# Patient Record
Sex: Female | Born: 1980 | Race: Black or African American | Hispanic: No | Marital: Single | Smoking: Former smoker
Health system: Southern US, Community
[De-identification: ages and names within clinical notes are randomized; demographics above are authoritative.]

## PROBLEM LIST (undated history)

## (undated) DIAGNOSIS — J189 Pneumonia, unspecified organism: Secondary | ICD-10-CM

## (undated) HISTORY — PX: OVARIAN CYST SURGERY: SHX726

## (undated) HISTORY — PX: TUBAL LIGATION: SHX77

---

## 1999-04-05 ENCOUNTER — Other Ambulatory Visit: Admission: RE | Admit: 1999-04-05 | Discharge: 1999-04-05 | Payer: Self-pay | Admitting: Obstetrics

## 1999-04-07 ENCOUNTER — Other Ambulatory Visit: Admission: RE | Admit: 1999-04-07 | Discharge: 1999-04-07 | Payer: Self-pay | Admitting: Obstetrics

## 1999-12-20 ENCOUNTER — Other Ambulatory Visit: Admission: RE | Admit: 1999-12-20 | Discharge: 1999-12-20 | Payer: Self-pay | Admitting: *Deleted

## 2019-09-26 ENCOUNTER — Other Ambulatory Visit: Payer: Self-pay | Admitting: Neurosurgery

## 2019-10-03 NOTE — Progress Notes (Addendum)
No Pharmacies Listed    Your procedure is scheduled on Thursday, October 8th.  Report to [facility]tr[facility] "A" at 12:45 P.M., and check in at the Admitting office.  Call this number if you have problems the morning of surgery:  [phone]  Call [phone] if you have any questions prior to your surgery date Monday-Friday 8am-4pm   Remember:  Do not eat or drink after midnight the night before your surgery   Take these medicines the morning of surgery with A SIP OF WATER: gabapentin (NEURONTIN)  As of today, STOP taking any Aspirin (unless otherwise instructed by your surgeon), celecoxib (CELEBREX), Aleve, Naproxen, Ibuprofen, Motrin, Advil, Goody's, BC's, all herbal medications, fish oil, and all vitamins.   The Morning of Surgery  Do not wear jewelry, make-up or nail polish.  Do not wear lotions, powders, or perfumes, or deodorant  Do not shave 48 hours prior to surgery.   Do not bring valuables to the hospital.  [facility] is not responsible for any belongings or valuables.  If you are a smoker, DO NOT Smoke 24 hours prior to surgery IF you wear a CPAP at night please bring your mask, tubing, and machine the morning of surgery   Remember that you must have someone to transport you home after your surgery, and remain with you for 24 hours if you are discharged the same day.  Contacts, glasses, hearing aids, dentures or bridgework may not be worn into surgery.   Leave your suitcase in the car.  After surgery it may be brought to your room.  For patients admitted to the hospital, discharge time will be determined by your treatment team.  Patients discharged the day of surgery will not be allowed to drive home.   Special instructions:   [place]- Preparing For Surgery  Before surgery, you can play an important role. Because skin is not sterile, your skin needs to be as free of germs as possible. You can reduce the number of germs on your skin by washing with CHG  (chlorahexidine gluconate) Soap before surgery.  CHG is an antiseptic cleaner which kills germs and bonds with the skin to continue killing germs even after washing.    Oral Hygiene is also important to reduce your risk of infection.  Remember - BRUSH YOUR TEETH THE MORNING OF SURGERY WITH YOUR REGULAR TOOTHPASTE  Please do not use if you have an allergy to CHG or antibacterial soaps. If your skin becomes reddened/irritated stop using the CHG.  Do not shave (including legs and underarms) for at least 48 hours prior to first CHG shower. It is OK to shave your face.  Please follow these instructions carefully.   1. Shower the NIGHT BEFORE SURGERY and the MORNING OF SURGERY with CHG Soap.   2. If you chose to wash your hair, wash your hair first as usual with your normal shampoo.  3. After you shampoo, rinse your hair and body thoroughly to remove the shampoo.  4. Use CHG as you would any other liquid soap. You can apply CHG directly to the skin and wash gently with a scrungie or a clean washcloth.   5. Apply the CHG Soap to your body ONLY FROM THE NECK DOWN.  Do not use on open wounds or open sores. Avoid contact with your eyes, ears, mouth and genitals (private parts). Wash Face and genitals (private parts)  with your normal soap.   6. Wash thoroughly, paying special attention to the area where  your surgery will be performed.  7. Thoroughly rinse your body with warm water from the neck down.  8. DO NOT shower/wash with your normal soap after using and rinsing off the CHG Soap.  9. Pat yourself dry with a CLEAN TOWEL.  10. Wear CLEAN PAJAMAS to bed the night before surgery, wear comfortable clothes the morning of surgery  11. Place CLEAN SHEETS on your bed the night of your first shower and DO NOT SLEEP WITH PETS.  Day of Surgery: Do not apply any deodorants/lotions. Please shower the morning of surgery with the CHG soap  Please wear clean clothes to the hospital/surgery center.    Remember to brush your teeth WITH YOUR REGULAR TOOTHPASTE.  Please read over the following fact sheets that you were given.

## 2019-10-04 ENCOUNTER — Encounter (HOSPITAL_COMMUNITY): Payer: Self-pay

## 2019-10-04 ENCOUNTER — Encounter (HOSPITAL_COMMUNITY)
Admission: RE | Admit: 2019-10-04 | Discharge: 2019-10-04 | Disposition: A | Discharge status: Home / Self Care | Payer: Medicaid Other | Source: Ambulatory Visit | Attending: Neurosurgery | Admitting: Neurosurgery

## 2019-10-04 ENCOUNTER — Other Ambulatory Visit (HOSPITAL_COMMUNITY)
Admission: RE | Admit: 2019-10-04 | Discharge: 2019-10-04 | Disposition: A | Discharge status: Home / Self Care | Payer: Medicaid Other | Source: Ambulatory Visit | Attending: Neurosurgery | Admitting: Neurosurgery

## 2019-10-04 ENCOUNTER — Other Ambulatory Visit: Payer: Self-pay

## 2019-10-04 DIAGNOSIS — Z20828 Contact with and (suspected) exposure to other viral communicable diseases: Secondary | ICD-10-CM | POA: Diagnosis not present

## 2019-10-04 DIAGNOSIS — Z01812 Encounter for preprocedural laboratory examination: Secondary | ICD-10-CM | POA: Insufficient documentation

## 2019-10-04 LAB — CBC
HCT: 35.3 % — ABNORMAL LOW (ref 36.0–46.0)
Hemoglobin: 10.9 g/dL — ABNORMAL LOW (ref 12.0–15.0)
MCH: 26.9 pg (ref 26.0–34.0)
MCHC: 30.9 g/dL (ref 30.0–36.0)
MCV: 87.2 fL (ref 80.0–100.0)
Platelets: 430 10*3/uL — ABNORMAL HIGH (ref 150–400)
RBC: 4.05 MIL/uL (ref 3.87–5.11)
RDW: 16.7 % — ABNORMAL HIGH (ref 11.5–15.5)
WBC: 8.2 10*3/uL (ref 4.0–10.5)
nRBC: 0 % (ref 0.0–0.2)

## 2019-10-04 LAB — SURGICAL PCR SCREEN
MRSA, PCR: NEGATIVE
Staphylococcus aureus: NEGATIVE

## 2019-10-04 LAB — SARS CORONAVIRUS 2 (TAT 6-24 HRS): SARS Coronavirus 2: NEGATIVE

## 2019-10-04 NOTE — Progress Notes (Signed)
PCP - per patient [facility] Cardiologist - denies  Chest x-ray - N/A EKG - N/A Stress Test - denies ECHO - denies Cardiac Cath - denies  Sleep Study - No CPAP - No  Blood Thinner Instructions: N/A Aspirin Instructions: N/A  ERAS Protcol - N/A PRE-SURGERY Ensure - N/A  COVID TEST- Scheduled for today 10-/05/2019  Anesthesia review: No  Patient denies shortness of breath, fever, cough and chest pain at PAT appointment  Patient verbalized understanding of instructions that were given to them at the PAT appointment. Patient was also instructed that they will need to review over the PAT instructions again at home before surgery.

## 2019-10-06 ENCOUNTER — Other Ambulatory Visit: Payer: Self-pay

## 2019-10-06 ENCOUNTER — Encounter (HOSPITAL_COMMUNITY): Payer: Self-pay

## 2019-10-06 ENCOUNTER — Ambulatory Visit (HOSPITAL_COMMUNITY): Payer: Medicaid Other | Admitting: Certified Registered Nurse Anesthetist

## 2019-10-06 ENCOUNTER — Ambulatory Visit (HOSPITAL_COMMUNITY)
Admission: RE | Admit: 2019-10-06 | Discharge: 2019-10-06 | Disposition: A | Discharge status: Home / Self Care | Payer: Medicaid Other | Attending: Neurosurgery | Admitting: Neurosurgery

## 2019-10-06 ENCOUNTER — Encounter (HOSPITAL_COMMUNITY)
Admission: RE | Disposition: A | Discharge status: Home / Self Care | Payer: Self-pay | Source: Home / Self Care | Attending: Neurosurgery

## 2019-10-06 ENCOUNTER — Ambulatory Visit (HOSPITAL_COMMUNITY): Payer: Medicaid Other

## 2019-10-06 DIAGNOSIS — Z87891 Personal history of nicotine dependence: Secondary | ICD-10-CM | POA: Insufficient documentation

## 2019-10-06 DIAGNOSIS — R531 Weakness: Secondary | ICD-10-CM | POA: Diagnosis not present

## 2019-10-06 DIAGNOSIS — M5126 Other intervertebral disc displacement, lumbar region: Secondary | ICD-10-CM | POA: Diagnosis present

## 2019-10-06 DIAGNOSIS — M5127 Other intervertebral disc displacement, lumbosacral region: Secondary | ICD-10-CM | POA: Insufficient documentation

## 2019-10-06 DIAGNOSIS — Z419 Encounter for procedure for purposes other than remedying health state, unspecified: Secondary | ICD-10-CM

## 2019-10-06 HISTORY — PX: LUMBAR LAMINECTOMY/DECOMPRESSION MICRODISCECTOMY: SHX5026

## 2019-10-06 LAB — POCT PREGNANCY, URINE: Preg Test, Ur: NEGATIVE

## 2019-10-06 IMAGING — CR DG LUMBAR SPINE 2-3V
2 series · 2 of 2 positions shown · non-contrast
Comparison: [DATE], CT [DATE]

CLINICAL DATA: Localization for L5-S1 micro discectomy

EXAM:
LUMBAR SPINE - 2-3 VIEW

[xtable lateral (1 of 2)]
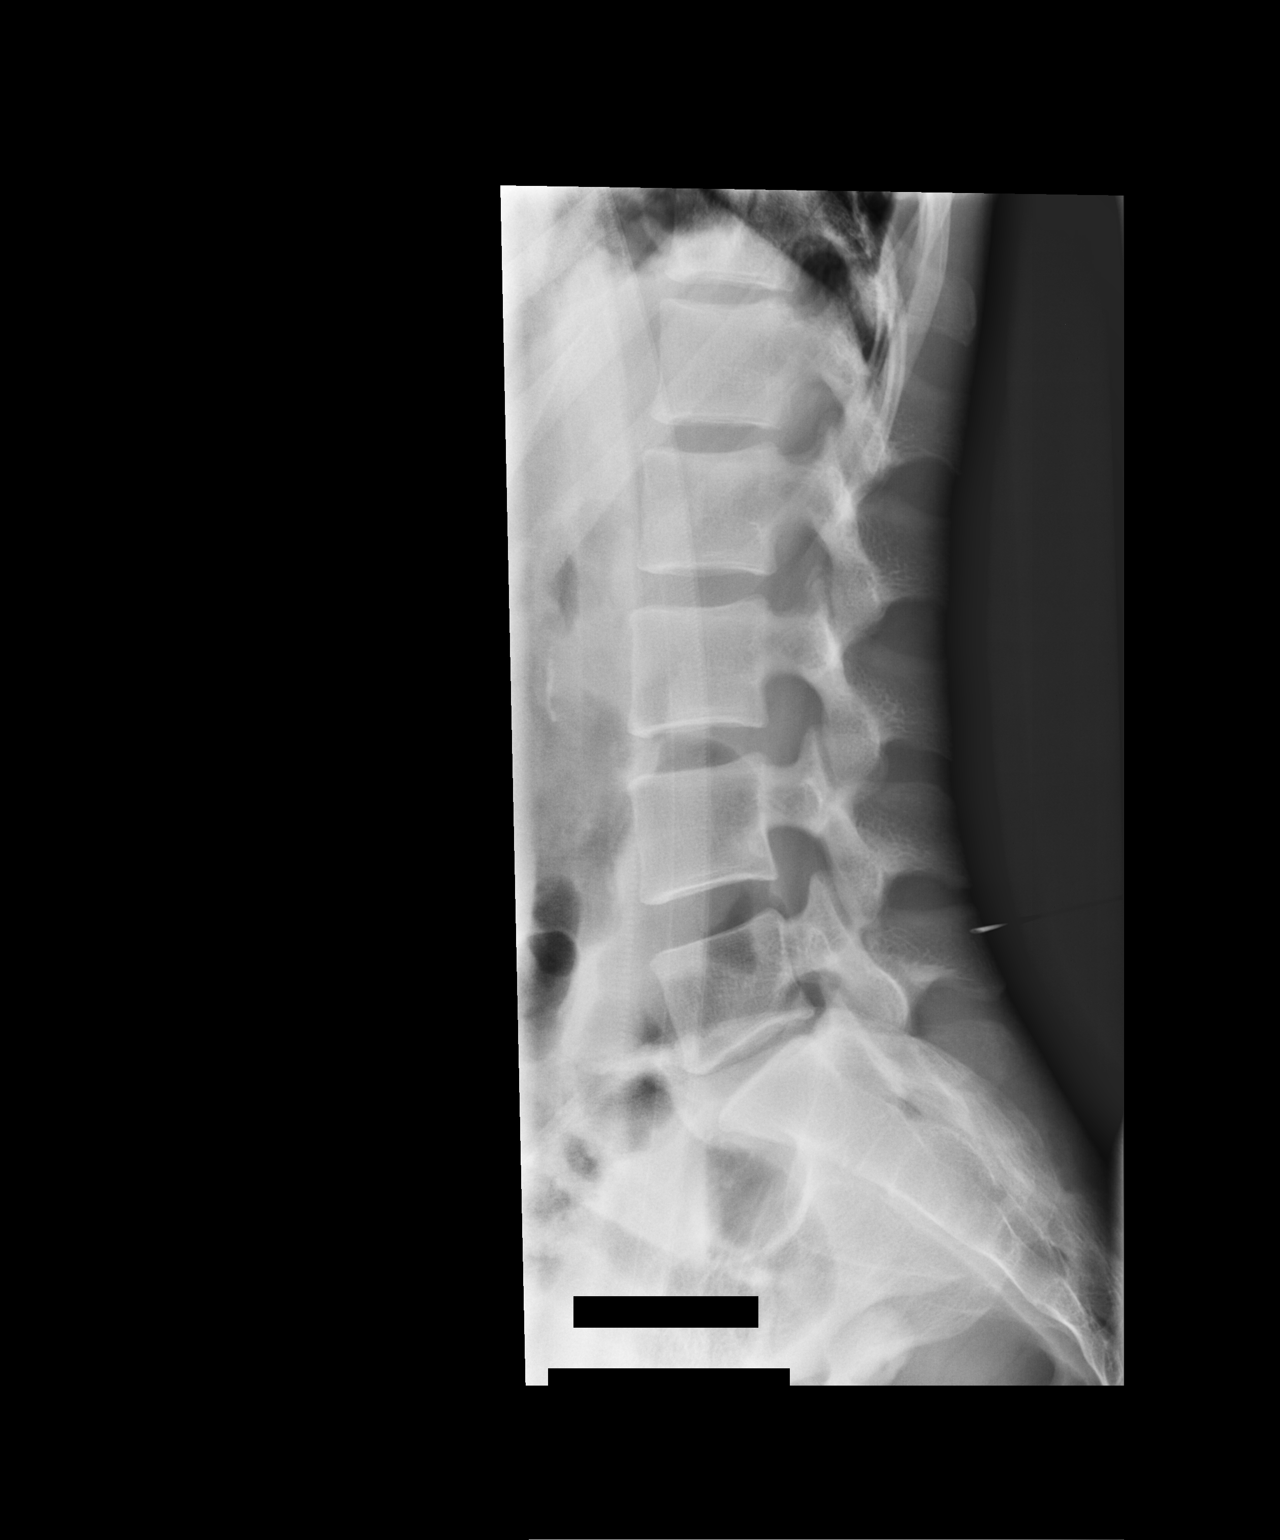

[xtable lateral (2 of 2)]
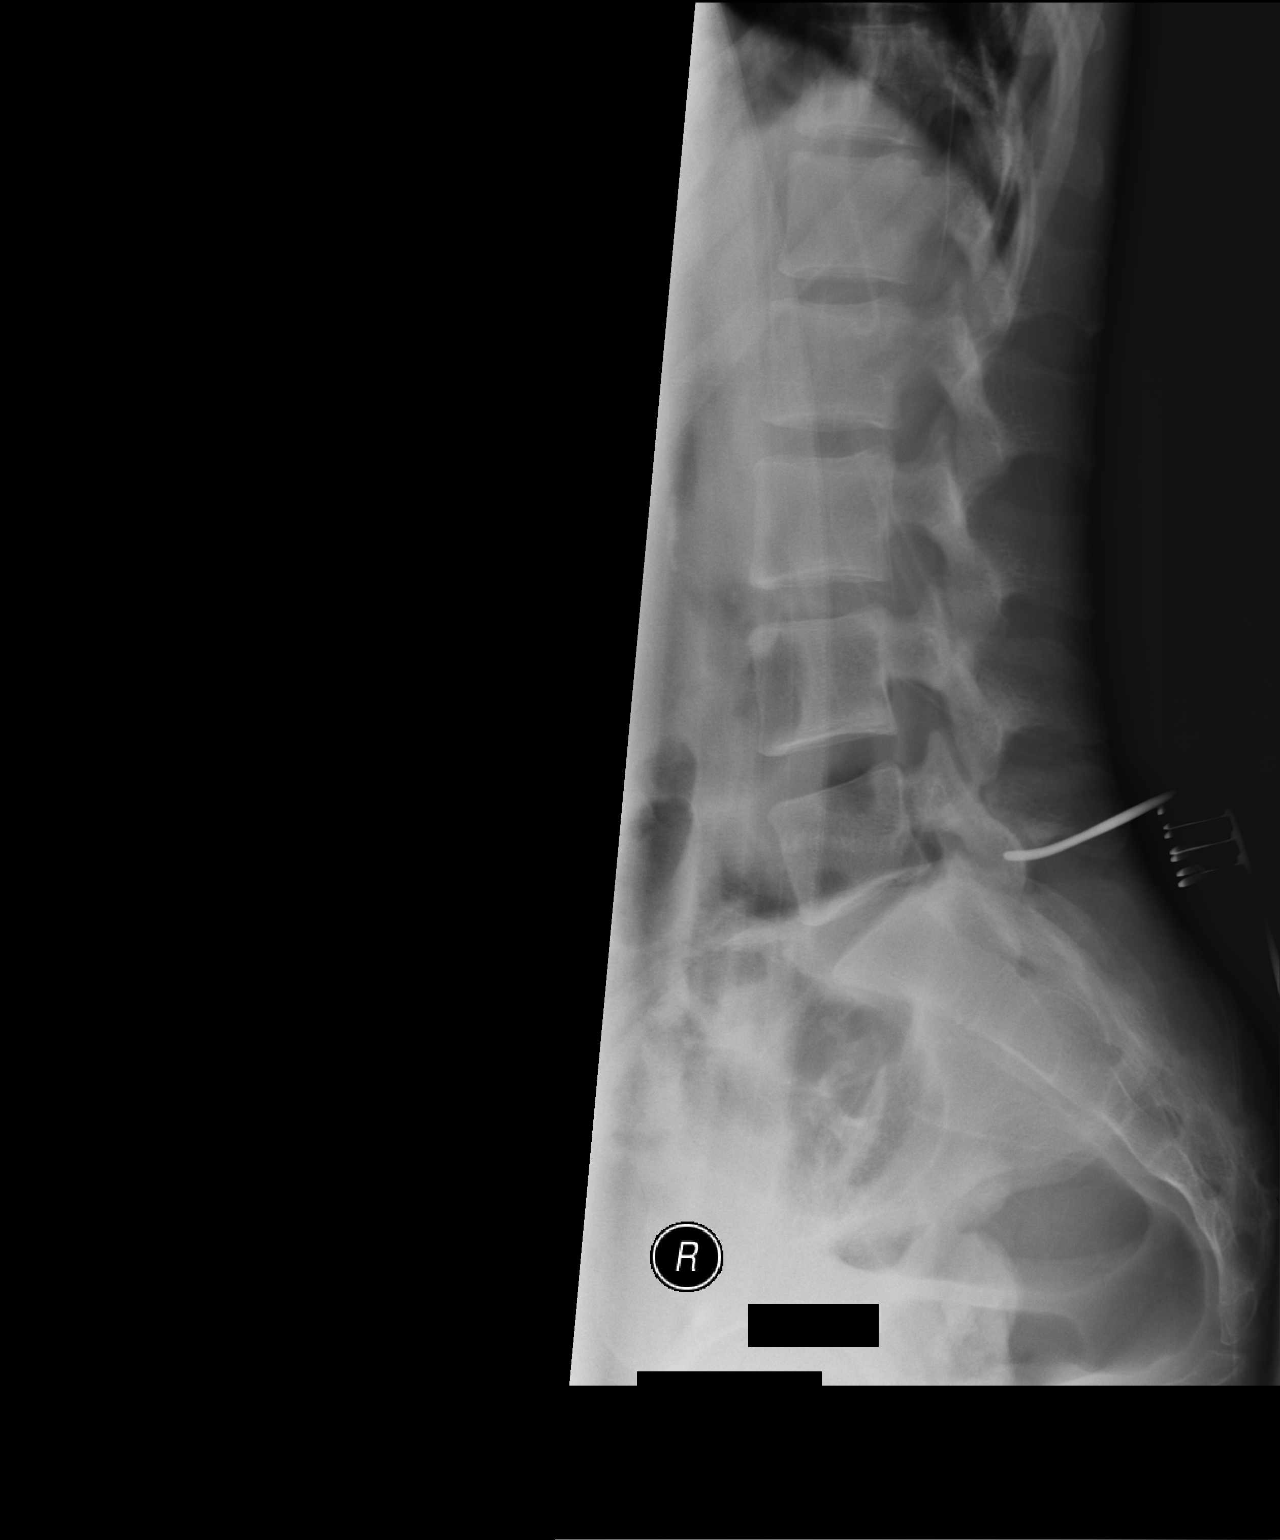

[2 of 2 positions shown; findings below may reference images not displayed]

FINDINGS: Two lateral images of the lumbar spine are submitted. Image labeled
number 1 shows a linear localizing instrument overlying the
posterior spinous process at L5. Image labeled number 2 show
surgical retractor device and linear localizing instrument overlying
the posterior elements at L5-S1.
IMPRESSION: Limited lateral views of the lumbar spine for intraoperative
localization purposes.

## 2019-10-06 SURGERY — LUMBAR LAMINECTOMY/DECOMPRESSION MICRODISCECTOMY 1 LEVEL
Anesthesia: General | Laterality: Right

## 2019-10-06 MED ORDER — THROMBIN 5000 UNITS EX SOLR
CUTANEOUS | Status: AC
  Filled 2019-10-06: qty 5000

## 2019-10-06 MED ORDER — ONDANSETRON HCL 4 MG/2ML IJ SOLN
INTRAMUSCULAR | Status: AC
  Filled 2019-10-06: qty 2

## 2019-10-06 MED ORDER — ONDANSETRON HCL 4 MG PO TABS: 4.0000 mg | ORAL_TABLET | Freq: Four times a day (QID) | ORAL | Status: DC | PRN

## 2019-10-06 MED ORDER — HYDROMORPHONE HCL 1 MG/ML IJ SOLN
INTRAMUSCULAR | Status: AC
  Filled 2019-10-06: qty 1

## 2019-10-06 MED ORDER — OXYCODONE HCL ER 10 MG PO T12A: 10.0000 mg | EXTENDED_RELEASE_TABLET | Freq: Two times a day (BID) | ORAL | Status: DC

## 2019-10-06 MED ORDER — PROPOFOL 10 MG/ML IV BOLUS
INTRAVENOUS | Status: DC | PRN
  Administered 2019-10-06: 20 mg via INTRAVENOUS
  Administered 2019-10-06: 130 mg via INTRAVENOUS

## 2019-10-06 MED ORDER — FENTANYL CITRATE (PF) 100 MCG/2ML IJ SOLN
INTRAMUSCULAR | Status: DC | PRN
  Administered 2019-10-06: 100 ug via INTRAVENOUS

## 2019-10-06 MED ORDER — MEPERIDINE HCL 25 MG/ML IJ SOLN: 6.2500 mg | INTRAMUSCULAR | Status: DC | PRN

## 2019-10-06 MED ORDER — MIDAZOLAM HCL 2 MG/2ML IJ SOLN
INTRAMUSCULAR | Status: DC | PRN
  Administered 2019-10-06: 2 mg via INTRAVENOUS

## 2019-10-06 MED ORDER — MENTHOL 3 MG MT LOZG: 1.0000 | LOZENGE | OROMUCOSAL | Status: DC | PRN

## 2019-10-06 MED ORDER — ONDANSETRON HCL 4 MG/2ML IJ SOLN: 4.0000 mg | Freq: Four times a day (QID) | INTRAMUSCULAR | Status: DC | PRN

## 2019-10-06 MED ORDER — TIZANIDINE HCL 4 MG PO TABS: 4.0000 mg | ORAL_TABLET | Freq: Three times a day (TID) | ORAL | Status: DC | PRN

## 2019-10-06 MED ORDER — CEFAZOLIN SODIUM-DEXTROSE 2-3 GM-%(50ML) IV SOLR
INTRAVENOUS | Status: DC | PRN
  Administered 2019-10-06: 2 g via INTRAVENOUS

## 2019-10-06 MED ORDER — SODIUM CHLORIDE 0.9% FLUSH: 3.0000 mL | Freq: Two times a day (BID) | INTRAVENOUS | Status: DC

## 2019-10-06 MED ORDER — BUPIVACAINE HCL (PF) 0.5 % IJ SOLN
INTRAMUSCULAR | Status: AC
  Filled 2019-10-06: qty 30

## 2019-10-06 MED ORDER — DEXMEDETOMIDINE HCL 200 MCG/2ML IV SOLN
INTRAVENOUS | Status: DC | PRN
  Administered 2019-10-06: 12 ug via INTRAVENOUS

## 2019-10-06 MED ORDER — ONDANSETRON HCL 4 MG/2ML IJ SOLN
INTRAMUSCULAR | Status: DC | PRN
  Administered 2019-10-06: 4 mg via INTRAVENOUS

## 2019-10-06 MED ORDER — LACTATED RINGERS IV SOLN
INTRAVENOUS | Status: DC
  Administered 2019-10-06 (×2): via INTRAVENOUS

## 2019-10-06 MED ORDER — FENTANYL CITRATE (PF) 100 MCG/2ML IJ SOLN
INTRAMUSCULAR | Status: AC
  Administered 2019-10-06: 50 ug via INTRAVENOUS
  Filled 2019-10-06: qty 2

## 2019-10-06 MED ORDER — LIDOCAINE-EPINEPHRINE 0.5 %-1:200000 IJ SOLN
INTRAMUSCULAR | Status: AC
  Filled 2019-10-06: qty 1

## 2019-10-06 MED ORDER — SODIUM CHLORIDE 0.9 % IV SOLN
INTRAVENOUS | Status: DC | PRN
  Administered 2019-10-06: 16:00:00 30 ug/min via INTRAVENOUS

## 2019-10-06 MED ORDER — DEXAMETHASONE SODIUM PHOSPHATE 10 MG/ML IJ SOLN
INTRAMUSCULAR | Status: AC
  Filled 2019-10-06: qty 1

## 2019-10-06 MED ORDER — MIDAZOLAM HCL 2 MG/2ML IJ SOLN
INTRAMUSCULAR | Status: AC
  Filled 2019-10-06: qty 2

## 2019-10-06 MED ORDER — GABAPENTIN 300 MG PO CAPS: 900.0000 mg | ORAL_CAPSULE | Freq: Two times a day (BID) | ORAL | Status: DC

## 2019-10-06 MED ORDER — DEXAMETHASONE SODIUM PHOSPHATE 10 MG/ML IJ SOLN
INTRAMUSCULAR | Status: DC | PRN
  Administered 2019-10-06: 5 mg via INTRAVENOUS

## 2019-10-06 MED ORDER — FENTANYL CITRATE (PF) 100 MCG/2ML IJ SOLN
INTRAMUSCULAR | Status: AC
  Filled 2019-10-06: qty 2

## 2019-10-06 MED ORDER — METHYLPREDNISOLONE ACETATE 80 MG/ML IJ SUSP
INTRAMUSCULAR | Status: AC
  Filled 2019-10-06: qty 1

## 2019-10-06 MED ORDER — PHENYLEPHRINE 40 MCG/ML (10ML) SYRINGE FOR IV PUSH (FOR BLOOD PRESSURE SUPPORT)
PREFILLED_SYRINGE | INTRAVENOUS | Status: DC | PRN
  Administered 2019-10-06 (×2): 80 ug via INTRAVENOUS
  Administered 2019-10-06: 40 ug via INTRAVENOUS

## 2019-10-06 MED ORDER — FENTANYL CITRATE (PF) 250 MCG/5ML IJ SOLN
INTRAMUSCULAR | Status: DC | PRN
  Administered 2019-10-06: 50 ug via INTRAVENOUS
  Administered 2019-10-06: 25 ug via INTRAVENOUS
  Administered 2019-10-06: 50 ug via INTRAVENOUS
  Administered 2019-10-06 (×2): 100 ug via INTRAVENOUS
  Administered 2019-10-06: 50 ug via INTRAVENOUS
  Administered 2019-10-06: 25 ug via INTRAVENOUS

## 2019-10-06 MED ORDER — ARTIFICIAL TEARS OPHTHALMIC OINT
TOPICAL_OINTMENT | OPHTHALMIC | Status: AC
  Filled 2019-10-06: qty 3.5

## 2019-10-06 MED ORDER — LIDOCAINE HCL (CARDIAC) PF 100 MG/5ML IV SOSY
PREFILLED_SYRINGE | INTRAVENOUS | Status: DC | PRN
  Administered 2019-10-06: 100 mg via INTRAVENOUS

## 2019-10-06 MED ORDER — SUGAMMADEX SODIUM 200 MG/2ML IV SOLN
INTRAVENOUS | Status: DC | PRN
  Administered 2019-10-06: 180 mg via INTRAVENOUS

## 2019-10-06 MED ORDER — SODIUM CHLORIDE 0.9% FLUSH: 3.0000 mL | INTRAVENOUS | Status: DC | PRN

## 2019-10-06 MED ORDER — 0.9 % SODIUM CHLORIDE (POUR BTL) OPTIME
TOPICAL | Status: DC | PRN
  Administered 2019-10-06: 1000 mL

## 2019-10-06 MED ORDER — KETOROLAC TROMETHAMINE 15 MG/ML IJ SOLN: 15.0000 mg | Freq: Four times a day (QID) | INTRAMUSCULAR | Status: DC

## 2019-10-06 MED ORDER — LIDOCAINE 2% (20 MG/ML) 5 ML SYRINGE
INTRAMUSCULAR | Status: AC
  Filled 2019-10-06: qty 5

## 2019-10-06 MED ORDER — ROCURONIUM BROMIDE 10 MG/ML (PF) SYRINGE
PREFILLED_SYRINGE | INTRAVENOUS | Status: AC
  Filled 2019-10-06: qty 10

## 2019-10-06 MED ORDER — FENTANYL CITRATE (PF) 250 MCG/5ML IJ SOLN
INTRAMUSCULAR | Status: AC
  Filled 2019-10-06: qty 5

## 2019-10-06 MED ORDER — SODIUM CHLORIDE 0.9 % IV SOLN: 250.0000 mL | INTRAVENOUS | Status: DC

## 2019-10-06 MED ORDER — ACETAMINOPHEN 650 MG RE SUPP: 650.0000 mg | RECTAL | Status: DC | PRN

## 2019-10-06 MED ORDER — THROMBIN 5000 UNITS EX SOLR
CUTANEOUS | Status: DC | PRN
  Administered 2019-10-06 (×2): 5000 [IU] via TOPICAL

## 2019-10-06 MED ORDER — ONDANSETRON HCL 4 MG/2ML IJ SOLN: 4.0000 mg | Freq: Once | INTRAMUSCULAR | Status: DC | PRN

## 2019-10-06 MED ORDER — PHENOL 1.4 % MT LIQD: 1.0000 | OROMUCOSAL | Status: DC | PRN

## 2019-10-06 MED ORDER — ARTIFICIAL TEARS OPHTHALMIC OINT
TOPICAL_OINTMENT | OPHTHALMIC | Status: DC | PRN
  Administered 2019-10-06: 1 via OPHTHALMIC

## 2019-10-06 MED ORDER — ROCURONIUM BROMIDE 10 MG/ML (PF) SYRINGE
PREFILLED_SYRINGE | INTRAVENOUS | Status: DC | PRN
  Administered 2019-10-06: 70 mg via INTRAVENOUS
  Administered 2019-10-06: 10 mg via INTRAVENOUS

## 2019-10-06 MED ORDER — BUPIVACAINE HCL (PF) 0.5 % IJ SOLN
INTRAMUSCULAR | Status: DC | PRN
  Administered 2019-10-06: 10 mL

## 2019-10-06 MED ORDER — ACETAMINOPHEN 325 MG PO TABS: 650.0000 mg | ORAL_TABLET | ORAL | Status: DC | PRN

## 2019-10-06 MED ORDER — HYDROMORPHONE HCL 1 MG/ML IJ SOLN
0.2500 mg | INTRAMUSCULAR | Status: DC | PRN
  Administered 2019-10-06: 18:00:00 0.25 mg via INTRAVENOUS
  Administered 2019-10-06: 0.5 mg via INTRAVENOUS

## 2019-10-06 MED ORDER — LIDOCAINE-EPINEPHRINE 0.5 %-1:200000 IJ SOLN
INTRAMUSCULAR | Status: DC | PRN
  Administered 2019-10-06: 3 mL

## 2019-10-06 MED ORDER — PROPOFOL 10 MG/ML IV BOLUS
INTRAVENOUS | Status: AC
  Filled 2019-10-06: qty 40

## 2019-10-06 MED ORDER — FENTANYL CITRATE (PF) 100 MCG/2ML IJ SOLN
50.0000 ug | Freq: Once | INTRAMUSCULAR | Status: AC
  Administered 2019-10-06: 15:00:00 50 ug via INTRAVENOUS

## 2019-10-06 MED ORDER — CEFAZOLIN SODIUM 1 G IJ SOLR
INTRAMUSCULAR | Status: AC
  Filled 2019-10-06: qty 20

## 2019-10-06 MED ORDER — POTASSIUM CHLORIDE IN NACL 20-0.9 MEQ/L-% IV SOLN: INTRAVENOUS | Status: DC

## 2019-10-06 MED ORDER — OXYCODONE HCL 5 MG PO TABS: 5.0000 mg | ORAL_TABLET | ORAL | Status: DC | PRN

## 2019-10-06 MED ORDER — METHYLPREDNISOLONE ACETATE 80 MG/ML IJ SUSP
INTRAMUSCULAR | Status: DC | PRN
  Administered 2019-10-06: 80 mg

## 2019-10-06 MED ORDER — OXYCODONE HCL 5 MG PO TABS: 10.0000 mg | ORAL_TABLET | ORAL | Status: DC | PRN

## 2019-10-06 MED ORDER — HEMOSTATIC AGENTS (NO CHARGE) OPTIME
TOPICAL | Status: DC | PRN
  Administered 2019-10-06: 1 via TOPICAL

## 2019-10-06 SURGICAL SUPPLY — 55 items
BENZOIN TINCTURE PRP APPL 2/3 (GAUZE/BANDAGES/DRESSINGS) IMPLANT
BLADE CLIPPER SURG (BLADE) IMPLANT
BUR MATCHSTICK NEURO 3.0 LAGG (BURR) ×2 IMPLANT
BUR PRECISION FLUTE 5.0 (BURR) IMPLANT
CANISTER SUCT 3000ML PPV (MISCELLANEOUS) ×2 IMPLANT
CARTRIDGE OIL MAESTRO DRILL (MISCELLANEOUS) ×1 IMPLANT
COVER WAND RF STERILE (DRAPES) ×2 IMPLANT
DECANTER SPIKE VIAL GLASS SM (MISCELLANEOUS) ×2 IMPLANT
DERMABOND ADHESIVE PROPEN (GAUZE/BANDAGES/DRESSINGS) ×1
DERMABOND ADVANCED (GAUZE/BANDAGES/DRESSINGS) ×1
DERMABOND ADVANCED .7 DNX12 (GAUZE/BANDAGES/DRESSINGS) ×1 IMPLANT
DERMABOND ADVANCED .7 DNX6 (GAUZE/BANDAGES/DRESSINGS) IMPLANT
DIFFUSER DRILL AIR PNEUMATIC (MISCELLANEOUS) ×2 IMPLANT
DRAPE LAPAROTOMY 100X72X124 (DRAPES) ×2 IMPLANT
DRAPE MICROSCOPE LEICA (MISCELLANEOUS) ×2 IMPLANT
DRAPE SURG 17X23 STRL (DRAPES) ×2 IMPLANT
DURAPREP 26ML APPLICATOR (WOUND CARE) ×2 IMPLANT
ELECT REM PT RETURN 9FT ADLT (ELECTROSURGICAL) ×2
ELECTRODE REM PT RTRN 9FT ADLT (ELECTROSURGICAL) ×1 IMPLANT
GAUZE 4X4 16PLY RFD (DISPOSABLE) IMPLANT
GAUZE SPONGE 4X4 12PLY STRL (GAUZE/BANDAGES/DRESSINGS) IMPLANT
GLOVE BIOGEL PI IND STRL 7.0 (GLOVE) IMPLANT
GLOVE BIOGEL PI INDICATOR 7.0 (GLOVE) ×1
GLOVE ECLIPSE 6.5 STRL STRAW (GLOVE) ×2 IMPLANT
GLOVE EXAM NITRILE XL STR (GLOVE) IMPLANT
GLOVE SURG SS PI 6.5 STRL IVOR (GLOVE) ×2 IMPLANT
GOWN STRL REUS W/ TWL LRG LVL3 (GOWN DISPOSABLE) ×2 IMPLANT
GOWN STRL REUS W/ TWL XL LVL3 (GOWN DISPOSABLE) IMPLANT
GOWN STRL REUS W/TWL 2XL LVL3 (GOWN DISPOSABLE) IMPLANT
GOWN STRL REUS W/TWL LRG LVL3 (GOWN DISPOSABLE) ×2
GOWN STRL REUS W/TWL XL LVL3 (GOWN DISPOSABLE)
KIT BASIN OR (CUSTOM PROCEDURE TRAY) ×2 IMPLANT
KIT TURNOVER KIT B (KITS) ×2 IMPLANT
NDL HYPO 18GX1.5 BLUNT FILL (NEEDLE) IMPLANT
NDL HYPO 25X1 1.5 SAFETY (NEEDLE) ×1 IMPLANT
NDL SPNL 18GX3.5 QUINCKE PK (NEEDLE) IMPLANT
NEEDLE HYPO 18GX1.5 BLUNT FILL (NEEDLE) ×2 IMPLANT
NEEDLE HYPO 25X1 1.5 SAFETY (NEEDLE) ×2 IMPLANT
NEEDLE SPNL 18GX3.5 QUINCKE PK (NEEDLE) IMPLANT
NS IRRIG 1000ML POUR BTL (IV SOLUTION) ×2 IMPLANT
OIL CARTRIDGE MAESTRO DRILL (MISCELLANEOUS) ×2
PACK LAMINECTOMY NEURO (CUSTOM PROCEDURE TRAY) ×2 IMPLANT
PAD ARMBOARD 7.5X6 YLW CONV (MISCELLANEOUS) ×6 IMPLANT
RUBBERBAND STERILE (MISCELLANEOUS) ×4 IMPLANT
SPONGE LAP 4X18 RFD (DISPOSABLE) IMPLANT
SPONGE SURGIFOAM ABS GEL SZ50 (HEMOSTASIS) ×2 IMPLANT
STRIP CLOSURE SKIN 1/2X4 (GAUZE/BANDAGES/DRESSINGS) IMPLANT
SUT VIC AB 0 CT1 18XCR BRD8 (SUTURE) ×1 IMPLANT
SUT VIC AB 0 CT1 8-18 (SUTURE) ×1
SUT VIC AB 2-0 CT1 18 (SUTURE) ×2 IMPLANT
SUT VIC AB 3-0 SH 8-18 (SUTURE) ×2 IMPLANT
SYR 5ML LL (SYRINGE) ×1 IMPLANT
TOWEL GREEN STERILE (TOWEL DISPOSABLE) ×2 IMPLANT
TOWEL GREEN STERILE FF (TOWEL DISPOSABLE) ×2 IMPLANT
WATER STERILE IRR 1000ML POUR (IV SOLUTION) ×2 IMPLANT

## 2019-10-06 NOTE — H&P (Signed)
LMP 09/13/2019  Catherine Mullen comes in today for evaluation of pain that she is having around the right hip, weakness in the right hip flexors and pain that radiates into the posterior thigh, but never below the knee.  She in 2017, was identified as having a disc herniation at L5-S1 eccentric to the right.  She has been placed on Gabapentin.  She got better after that incident, but she says the pain will wax and wane, but she has never had such severe pain in the lower extremity before.  She has absolutely no discomfort on the left side.  She has had 2 CTs, one in 2017, and 1 this past month.  It is not clear why an MRI was never done, but it was not.  She was recently also started on Ibuprofen 600 mg 3 times a day.  She continues to work.     VITAL SIGNS :  Height 5 feet 1 inch, weight 112 pounds, blood pressure 123/80, pulse is 109, temperature is 96.6, pain is 7/10.     SOCIAL HISTORY :  Production designer, theatre/television/film at [employer].  She does have children.  She is right handed.  She does not use alcohol.  She has used tobacco.  She quit smoking cigarettes in 2014.  She is currently divorced. In her words, right leg feels like it wants to give out.     REVIEW OF SYSTEMS :  Positive for right lower extremity pain, right lower extremity weakness.  She has had no surgery previously and none that she listed at least.     PHYSICAL EXAMINATION :  Neurologic:  She is alert, oriented by 4. She answers all questions appropriately.  She is in some distress.  She has pain with passive flexion of the right hip.  Internal-external rotation did not seem to aggravate her pain.  She has no pain in the hip on the left side.  She has full strength at the plantar flexors, dorsiflexors, left quadriceps, hamstrings.  She has weakness in the hip flexors on the right side.  Some weakness may be due to the pain, but there is some weakness in the quadriceps on the right.  Gait is antalgic.  She is able to toe walk.  She is able to heel  walk.  She had difficulty taking a 14-inch step with her right lower extremity.  None exhibited on the left.  Reflexes brisk 2+ at the knees and ankles bilaterally and the biceps, triceps, and brachioradialis.  She had intact proprioception.  Normal muscle tone, bulk, and coordination.     ASSESSMENT AND PLAN :  I have reviewed the CTs and what she has is a large disc herniation which has been present, and maybe partially calcifying at L5-S1 eccentric to the right.  I did not see anything at L2-3 or L3-4 on the right side which would correspond to this weakness she has in the hip flexors or at L2-3.  I am not at all sure that this disc at 5-1 is not simply a red herring.  It has been there, has not changed and would not cause hip weakness.  I would like to have her undergo an MRI so we can see.  She is already involved in physical therapy.  She is taking an anti-inflammatory, and she has been under a physician's care for the last 3 years.  I will see her back after the MRI.   Catherine Mullen returns.  MRI still shows a large disc herniation  eccentric to the right side at L5-S1.  The disc is degenerated.  All the other levels L1-2, 2-3, 3-4, and 4-5 are completely normal and look quite healthy.  She has elected to undergo operative decompression of the right S1 nerve root.  We will hopefully get this scheduled for next week Thursday at [facility].  Risks and benefits of bleeding, infection, no relief, redo surgery, disc recurrence, and infection were discussed.  She understands and wishes to proceed.

## 2019-10-06 NOTE — Anesthesia Preprocedure Evaluation (Signed)
Anesthesia Evaluation  Patient identified by MRN, date of birth, ID band Patient awake    Reviewed: Allergy & Precautions, NPO status , Patient's Chart, lab work & pertinent test results  Airway Mallampati: I  TM Distance: >3 FB Neck ROM: Full    Dental   Pulmonary former smoker,    Pulmonary exam normal        Cardiovascular Normal cardiovascular exam     Neuro/Psych    GI/Hepatic   Endo/Other    Renal/GU      Musculoskeletal   Abdominal   Peds  Hematology   Anesthesia Other Findings   Reproductive/Obstetrics                             Anesthesia Physical Anesthesia Plan  ASA: II  Anesthesia Plan: General   Post-op Pain Management:    Induction: Intravenous  PONV Risk Score and Plan: 3 and Ondansetron, Dexamethasone and Midazolam  Airway Management Planned: Oral ETT  Additional Equipment:   Intra-op Plan:   Post-operative Plan: Extubation in OR  Informed Consent: I have reviewed the patients History and Physical, chart, labs and discussed the procedure including the risks, benefits and alternatives for the proposed anesthesia with the patient or authorized representative who has indicated his/her understanding and acceptance.     Plan Discussed with: CRNA and Surgeon  Anesthesia Plan Comments:         Anesthesia Quick Evaluation  

## 2019-10-06 NOTE — Op Note (Signed)
10/06/2019  5:29 PM  PATIENT:  Catherine Mullen  38 y.o. female with a large hnp L5/S1 eccentric to the right side. Conservative measures were not beneficial and she has opted for operative decompression  PRE-OPERATIVE DIAGNOSIS:  Lumbar herniated disc L5/S1 right  POST-OPERATIVE DIAGNOSIS:  Lumbar herniated disc L5/S1 right  PROCEDURE:  Procedure(s): Right Lumbar Five- Sacral One Microdiscectomy  SURGEON:   Surgeon(s): Ashok Pall, MD  ASSISTANTS:none  ANESTHESIA:   general  EBL:  Total I/O In: 1000 [I.V.:1000] Out: -   BLOOD ADMINISTERED:none  CELL SAVER GIVEN:none  COUNT:per nursing  DRAINS: none   SPECIMEN:  No Specimen  DICTATION: [name] was taken to the operating room, intubated and placed under a general anesthetic without difficulty. She was positioned prone on a Wilson frame with all pressure points padded. Her back was prepped and draped in a sterile manner. I opened the skin with a 10 blade and carried the dissection down to the thoracolumbar fascia. I used both sharp dissection and the monopolar cautery to expose the lamina of L5, and S1. I confirmed my location with an intraoperative xray.  I used the drill, Kerrison punches, and curettes to perform a semihemilaminectomy of S1. I used the punches to remove the ligamentum flavum to expose the thecal sac. I brought the microscope into the operative field and I started the decompression of the spinal canal, thecal sac and S1 root(s). I cauterized epidural veins overlying the disc space then divided them sharply. I opened the disc space with a 15 blade and proceeded with the discectomy. I used pituitary rongeurs, curettes, and other instruments to remove disc material. After the discectomy was completed I inspected the S1 nerve root and felt it was well decompressed. I explored rostrally, laterally, medially, and caudally and was satisfied with the decompression.I bathed the nerve root with depomedrol and fentanyl.  I  irrigated the wound, then closed in layers. I approximated the thoracolumbar fascia, subcutaneous, and subcuticular planes with vicryl sutures. I used dermabond for a sterile dressing.   PLAN OF CARE: Admit for overnight observation  PATIENT DISPOSITION:  PACU - hemodynamically stable.   Delay start of Pharmacological VTE agent (>24hrs) due to surgical blood loss or risk of bleeding:  yes

## 2019-10-06 NOTE — Anesthesia Procedure Notes (Signed)
Procedure Name: Intubation Date/Time: 10/06/2019 3:49 PM Performed by: Raenette Rover, CRNA Pre-anesthesia Checklist: Patient identified, Emergency Drugs available, Suction available and Patient being monitored Patient Re-evaluated:Patient Re-evaluated prior to induction Oxygen Delivery Method: Circle system utilized Preoxygenation: Pre-oxygenation with 100% oxygen Induction Type: IV induction Ventilation: Mask ventilation without difficulty Laryngoscope Size: Mac and 3 Grade View: Grade I Tube type: Oral Tube size: 7.0 mm Number of attempts: 1 Airway Equipment and Method: Stylet Placement Confirmation: ETT inserted through vocal cords under direct vision,  positive ETCO2 and breath sounds checked- equal and bilateral Secured at: 19 cm Tube secured with: Tape Dental Injury: Teeth and Oropharynx as per pre-operative assessment

## 2019-10-06 NOTE — Discharge Instructions (Signed)
Lumbar Discectomy °Care After °A discectomy involves removal of discmaterial (the cartilage-like structures located between the bones of the back). It is done to relieve pressure on nerve roots. It can be used as a treatment for a back problem. The time in surgery depends on the findings in surgery and what is necessary to correct the problems. °HOME CARE INSTRUCTIONS  °· Check the cut (incision) made by the surgeon twice a day for signs of infection. Some signs of infection may include:  °· A foul smelling, greenish or yellowish discharge from the wound.  °· Increased pain.  °· Increased redness over the incision (operative) site.  °· The skin edges may separate.  °· Flu-like symptoms (problems).  °· A temperature above 101.5° F (38.6° C).  °· Change your bandages in about 24 to 36 hours following surgery or as directed.  °· You may shower tomrrow.  Avoid bathtubs, swimming pools and hot tubs for three weeks or until your incision has healed completely. °· Follow your doctor's instructions as to safe activities, exercises, and physical therapy.  °· Weight reduction may be beneficial if you are overweight.  °· Daily exercise is helpful to prevent the return of problems. Walking is permitted. You may use a treadmill without an incline. Cut down on activities and exercise if you have discomfort. You may also go up and down stairs as much as you can tolerate.  °· DO NOT lift anything heavier than 10 to 15 lbs. Avoid bending or twisting at the waist. Always bend your knees when lifting.  °· Maintain strength and range of motion as instructed.  °· Do not drive for 10 days, or as directed by your doctors. You may be a passenger . Lying back in the passenger seat may be more comfortable for you. Always wear a seatbelt.  °· Limit your sitting in a regular chair to 20 to 30 minutes at a time. There are no limitations for sitting in a recliner. You should lie down or walk in between sitting periods.  °· Only take  over-the-counter or prescription medicines for pain, discomfort, or fever as directed by your caregiver.  °SEEK MEDICAL CARE IF:  °· There is increased bleeding (more than a small spot) from the wound.  °· You notice redness, swelling, or increasing pain in the wound.  °· Pus is coming from wound.  °· You develop an unexplained oral temperature above 102° F (38.9° C) develops.  °· You notice a foul smell coming from the wound or dressing.  °· You have increasing pain in your wound.  °SEEK IMMEDIATE MEDICAL CARE IF:  °· You develop a rash.  °· You have difficulty breathing.  °· You develop any allergic problems to medicines given.  °Document Released: 11/19/2004 Document Revised: 12/04/2011 Document Reviewed: 03/09/2008 °ExitCare® Patient Information °

## 2019-10-06 NOTE — Discharge Summary (Signed)
Physician Discharge Summary  Patient ID: Catherine Mullen MRN: [id number] DOB/AGE: 01-06-1981 38 y.o.  Admit date: 10/06/2019 Discharge date: 10/06/2019  Admission Diagnoses:HNP L5/s1, R   Discharge Diagnoses:  Active Problems:   HNP (herniated nucleus pulposus), lumbar   Discharged Condition: good  Hospital Course: [name] was admitted and taken to the operating room for an uncomplicated lumbar discetomy. Post op she is voiding, ambulating and tolerating a regular diet. Wound is clean, dry, and without signs of infection  Treatments: surgery: Right L5/S1 discetomy  Discharge Exam: Blood pressure (!) 145/89, pulse 90, temperature 98.5 F (36.9 C), temperature source Oral, resp. rate 18, height 5\' 1"  (1.549 m), weight 50 kg, last menstrual period 09/13/2019, SpO2 99 %. Neurologic: Alert and oriented X 3, normal strength and tone. Normal symmetric reflexes. Normal coordination and gait  Disposition: Discharge disposition: 01-Home or Self Care      Lumbar herniated disc  Allergies as of 10/06/2019   No Known Allergies     Medication List    STOP taking these medications   ibuprofen 600 MG tablet Commonly known as: ADVIL     TAKE these medications   celecoxib 200 MG capsule Commonly known as: CELEBREX Take 200 mg by mouth 2 (two) times daily.   gabapentin 300 MG capsule Commonly known as: NEURONTIN Take 900 mg by mouth 2 (two) times daily.   tiZANidine 4 MG tablet Commonly known as: ZANAFLEX Take 4 mg by mouth every 8 (eight) hours as needed for muscle spasms.      Follow-up Information    Ashok Pall, MD Follow up in 3 week(s).   Specialty: Neurosurgery Why: please call to make an appointment Contact information: [address] [place] [address] [phone]           Signed: Winfield Cunas 10/06/2019, 7:01 PM

## 2019-10-06 NOTE — Transfer of Care (Signed)
Immediate Anesthesia Transfer of Care Note  Patient: Catherine Mullen  Procedure(s) Performed: Right Lumbar Five- Sacral One Microdiscectomy (Right )  Patient Location: PACU  Anesthesia Type:General  Level of Consciousness: awake, alert , oriented and patient cooperative  Airway & Oxygen Therapy: Patient Spontanous Breathing and Patient connected to face mask oxygen  Post-op Assessment: Report given to RN and Post -op Vital signs reviewed and stable  Post vital signs: Reviewed and stable  Last Vitals:  Vitals Value Taken Time  BP 104/74 10/06/19 1729  Temp 37.1 C 10/06/19 1729  Pulse 99 10/06/19 1734  Resp 17 10/06/19 1734  SpO2 100 % 10/06/19 1734  Vitals shown include unvalidated device data.  Last Pain:  Vitals:   10/06/19 1729  TempSrc:   PainSc: 0-No pain         Complications: No apparent anesthesia complications

## 2019-10-06 NOTE — Anesthesia Postprocedure Evaluation (Signed)
Anesthesia Post Note  Patient: Catherine Mullen  Procedure(s) Performed: Right Lumbar Five- Sacral One Microdiscectomy (Right )     Patient location during evaluation: PACU Anesthesia Type: General Level of consciousness: awake and alert Pain management: pain level controlled Vital Signs Assessment: post-procedure vital signs reviewed and stable Respiratory status: spontaneous breathing, nonlabored ventilation, respiratory function stable and patient connected to nasal cannula oxygen Cardiovascular status: blood pressure returned to baseline and stable Postop Assessment: no apparent nausea or vomiting Anesthetic complications: no    Last Vitals:  Vitals:   10/06/19 1322 10/06/19 1729  BP: (!) 133/96 104/74  Pulse: 100 (!) 107  Resp: 18 17  Temp: 36.8 C 37.1 C  SpO2: 100% 100%    Last Pain:  Vitals:   10/06/19 1729  TempSrc:   PainSc: 0-No pain                 Diezel Mazur DAVID

## 2019-10-07 ENCOUNTER — Encounter (HOSPITAL_COMMUNITY): Payer: Self-pay | Admitting: Neurosurgery

## 2019-11-30 ENCOUNTER — Ambulatory Visit (HOSPITAL_COMMUNITY)
Admission: EM | Admit: 2019-11-30 | Discharge: 2019-11-30 | Disposition: A | Discharge status: Home / Self Care | Payer: Medicaid Other | Attending: Emergency Medicine | Admitting: Emergency Medicine

## 2019-11-30 ENCOUNTER — Other Ambulatory Visit: Payer: Self-pay

## 2019-11-30 ENCOUNTER — Encounter (HOSPITAL_COMMUNITY): Payer: Self-pay | Admitting: Emergency Medicine

## 2019-11-30 DIAGNOSIS — R1013 Epigastric pain: Secondary | ICD-10-CM

## 2019-11-30 DIAGNOSIS — R1010 Upper abdominal pain, unspecified: Secondary | ICD-10-CM

## 2019-11-30 MED ORDER — ALUM & MAG HYDROXIDE-SIMETH 200-200-20 MG/5ML PO SUSP
30.0000 mL | Freq: Once | ORAL | Status: AC
  Administered 2019-11-30: 30 mL via ORAL

## 2019-11-30 MED ORDER — FAMOTIDINE 20 MG PO TABS: 20.0000 mg | ORAL_TABLET | Freq: Two times a day (BID) | ORAL | 0 refills | Status: AC

## 2019-11-30 MED ORDER — ALUM & MAG HYDROXIDE-SIMETH 200-200-20 MG/5ML PO SUSP
ORAL | Status: AC
  Filled 2019-11-30: qty 30

## 2019-11-30 NOTE — ED Provider Notes (Signed)
[place]   MRN: [id number] DOB: 11/25/1981  Subjective:   Catherine Mullen is a 38 y.o. female presenting for acute onset of intermittent moderate to severe upper/epigastric abdominal pain.  Symptoms improved after taking medication that her friend gave her supposedly for acid reflux.  Has not tried any other medications for relief.  Thinks that symptoms are related to eating a pie she had last night.  No current facility-administered medications for this encounter.   Current Outpatient Medications:  .  gabapentin (NEURONTIN) 300 MG capsule, Take 900 mg by mouth 2 (two) times daily., Disp: , Rfl:  .  celecoxib (CELEBREX) 200 MG capsule, Take 200 mg by mouth 2 (two) times daily., Disp: , Rfl:  .  tiZANidine (ZANAFLEX) 4 MG tablet, Take 4 mg by mouth every 8 (eight) hours as needed for muscle spasms., Disp: , Rfl:    No Known Allergies  Denies chronic conditions.   Past Surgical History:  Procedure Laterality Date  . LUMBAR LAMINECTOMY/DECOMPRESSION MICRODISCECTOMY Right 10/06/2019   Procedure: Right Lumbar Five- Sacral One Microdiscectomy;  Surgeon: Ashok Pall, MD;  Location: [facility];  Service: Neurosurgery;  Laterality: Right;  Right Lumbar Five- Sacral One Microdiscectomy  . OVARIAN CYST SURGERY    . TUBAL LIGATION      Family History  Problem Relation Age of Onset  . Cancer Mother        Breast  . Healthy Sister   . Healthy Brother   . Healthy Brother     Social History   Tobacco Use  . Smoking status: Former Smoker    Types: Cigarettes    Quit date: 12/29/2012    Years since quitting: 6.9  . Smokeless tobacco: Never Used  Substance Use Topics  . Alcohol use: Never    Frequency: Never  . Drug use: Yes    Types: Marijuana    Comment: 5-6X/daily    Review of Systems  Constitutional: Negative for fever and malaise/fatigue.  HENT: Negative for congestion, ear pain, sinus pain and sore throat.   Eyes: Negative for blurred vision, double vision,  discharge and redness.  Respiratory: Negative for cough, hemoptysis, shortness of breath and wheezing.   Cardiovascular: Negative for chest pain.  Gastrointestinal: Positive for abdominal pain. Negative for blood in stool, constipation, diarrhea, heartburn, nausea and vomiting.  Genitourinary: Negative for dysuria, flank pain and hematuria.  Musculoskeletal: Negative for myalgias.  Skin: Negative for rash.  Neurological: Negative for dizziness, weakness and headaches.  Psychiatric/Behavioral: Negative for depression and substance abuse.     Objective:   Vitals: BP 130/83 (BP Location: Right Arm)   Pulse 89   Temp 98.3 F (36.8 C) (Oral)   Resp 18   LMP 11/07/2019 (Exact Date)   SpO2 100%   Physical Exam Constitutional:      General: She is not in acute distress.    Appearance: Normal appearance. She is well-developed and normal weight. She is not ill-appearing, toxic-appearing or diaphoretic.  HENT:     Head: Normocephalic and atraumatic.     Right Ear: External ear normal.     Left Ear: External ear normal.     Nose: Nose normal.     Mouth/Throat:     Mouth: Mucous membranes are moist.     Pharynx: Oropharynx is clear.  Eyes:     General: No scleral icterus.    Extraocular Movements: Extraocular movements intact.     Pupils: Pupils are equal, round, and reactive to light.  Cardiovascular:  Rate and Rhythm: Normal rate and regular rhythm.     Heart sounds: Normal heart sounds. No murmur. No friction rub. No gallop.   Pulmonary:     Effort: Pulmonary effort is normal. No respiratory distress.     Breath sounds: Normal breath sounds. No stridor. No wheezing, rhonchi or rales.  Abdominal:     General: Bowel sounds are normal. There is no distension.     Palpations: Abdomen is soft. There is no mass.     Tenderness: There is generalized abdominal tenderness and tenderness in the epigastric area. There is no right CVA tenderness, left CVA tenderness, guarding or rebound.   Skin:    General: Skin is warm and dry.     Coloration: Skin is not pale.     Findings: No rash.  Neurological:     General: No focal deficit present.     Mental Status: She is alert and oriented to person, place, and time.  Psychiatric:        Mood and Affect: Mood normal.        Behavior: Behavior normal.        Thought Content: Thought content normal.        Judgment: Judgment normal.     Assessment and Plan :   1. Epigastric pain   2. Pain of upper abdomen     Will use GI cocktail in clinic. Use Pepcid at home, dietary modifications. Vital signs stable for discharge. Consider h. Pylori infection. Follow up with PCP. Counseled patient on potential for adverse effects with medications prescribed/recommended today, strict ER and return-to-clinic precautions discussed, patient verbalized understanding.    Wallis Bamberg, [place] 11/30/19 1437

## 2019-11-30 NOTE — ED Triage Notes (Addendum)
Pt reports upper abdominal sharp pain that started this morning that radiates to her mid back.  Pt took an antiacid pill when she got to work that did not help.  She also tried [place] and Mustard with no relief.  Pt reports lumbar surgery last month and she was doing fine.  The pain she is having is different than before her surgery but it is in the same place.

## 2020-02-15 ENCOUNTER — Other Ambulatory Visit (HOSPITAL_BASED_OUTPATIENT_CLINIC_OR_DEPARTMENT_OTHER): Payer: Self-pay | Admitting: Neurosurgery

## 2020-02-15 DIAGNOSIS — M5126 Other intervertebral disc displacement, lumbar region: Secondary | ICD-10-CM

## 2020-03-05 ENCOUNTER — Ambulatory Visit (HOSPITAL_BASED_OUTPATIENT_CLINIC_OR_DEPARTMENT_OTHER)
Admission: RE | Admit: 2020-03-05 | Discharge: 2020-03-05 | Disposition: A | Discharge status: Home / Self Care | Payer: Medicaid Other | Source: Ambulatory Visit | Attending: Neurosurgery | Admitting: Neurosurgery

## 2020-03-05 ENCOUNTER — Other Ambulatory Visit: Payer: Self-pay

## 2020-03-05 DIAGNOSIS — M5126 Other intervertebral disc displacement, lumbar region: Secondary | ICD-10-CM | POA: Diagnosis not present

## 2020-03-05 IMAGING — DX DG LUMBAR SPINE 2-3V
3 series · 3 of 3 positions shown · non-contrast
Comparison: [DATE] and lumbar spine MR dated [DATE]. Lumbar
spine radiographs dated [DATE].

CLINICAL DATA: Mid lower back pain at the site of surgery performed
in [DATE] for a herniated disc.

EXAM:
LUMBAR SPINE - 2-3 VIEW

[l-spine ap]
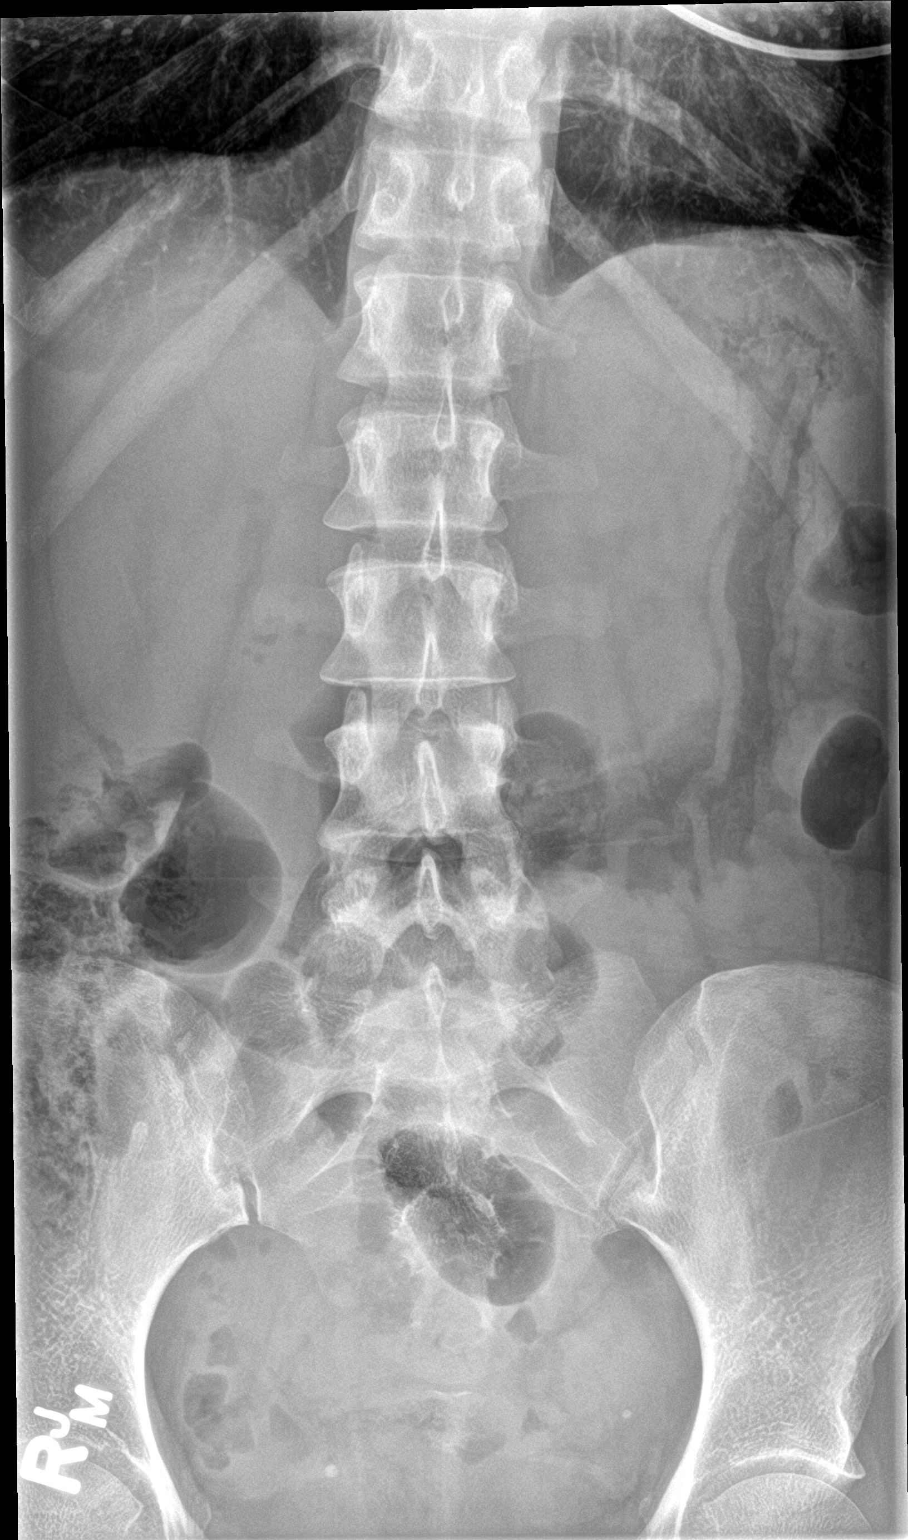

[l-spine lat]
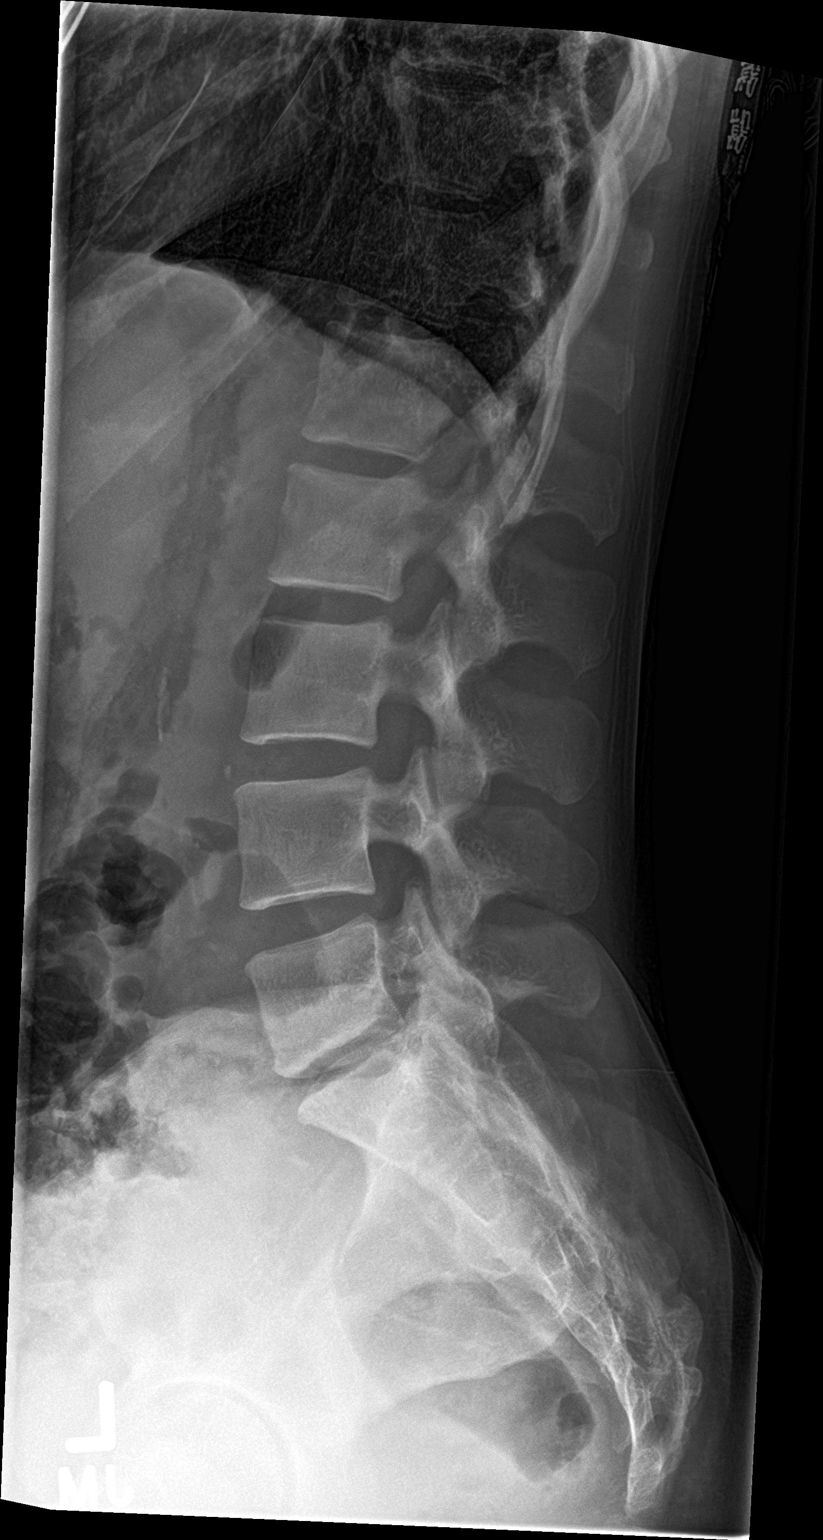

[l-spine spot]
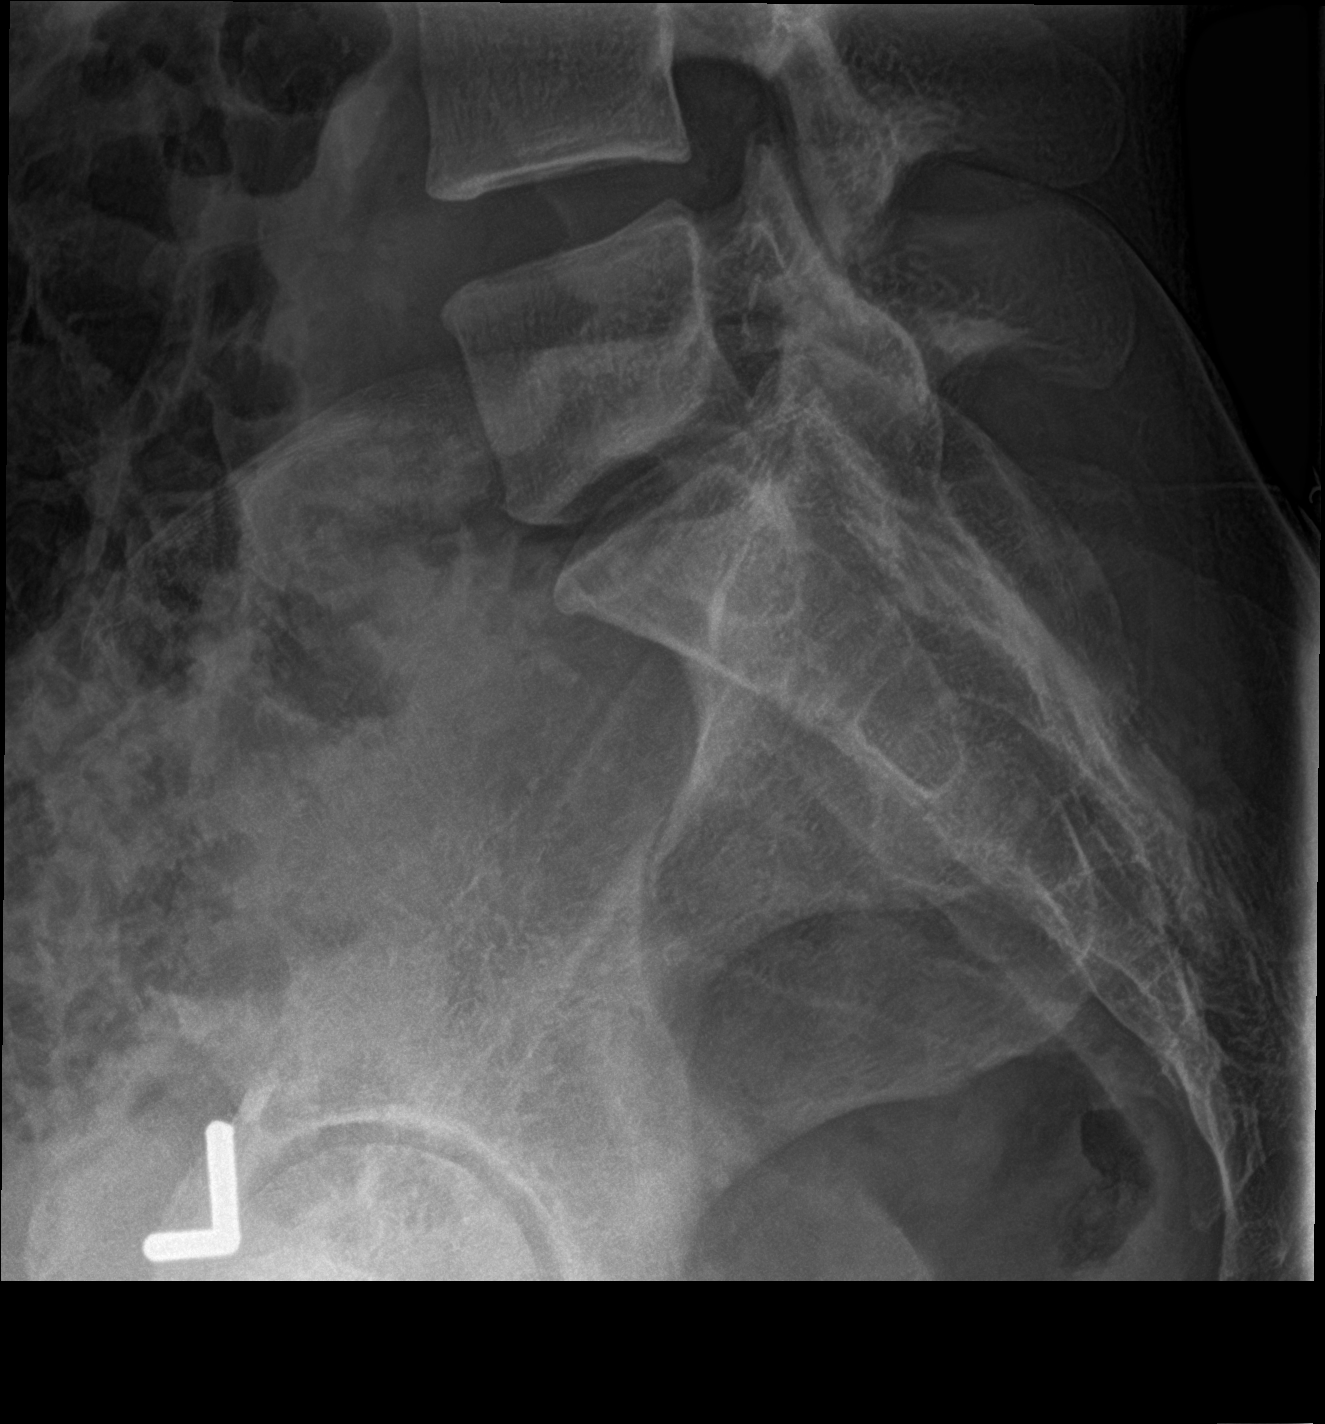

[3 of 3 positions shown; findings below may reference images not displayed]

FINDINGS: Five non-rib-bearing lumbar vertebrae. Minimal anterior spur
formation at the L5-S1 level with moderate disc space narrowing. No
fractures, pars defects or subluxations. No visible bone defects.
IMPRESSION: Minimal degenerative changes at the L5-S1 level.

## 2020-03-27 ENCOUNTER — Other Ambulatory Visit: Payer: Self-pay | Admitting: Neurosurgery

## 2020-03-27 DIAGNOSIS — M5126 Other intervertebral disc displacement, lumbar region: Secondary | ICD-10-CM

## 2020-04-08 ENCOUNTER — Other Ambulatory Visit: Payer: Self-pay

## 2020-04-08 ENCOUNTER — Ambulatory Visit (HOSPITAL_BASED_OUTPATIENT_CLINIC_OR_DEPARTMENT_OTHER)
Admission: RE | Admit: 2020-04-08 | Discharge: 2020-04-08 | Disposition: A | Discharge status: Home / Self Care | Payer: Medicaid Other | Source: Ambulatory Visit | Attending: Neurosurgery | Admitting: Neurosurgery

## 2020-04-08 DIAGNOSIS — M5126 Other intervertebral disc displacement, lumbar region: Secondary | ICD-10-CM | POA: Insufficient documentation

## 2020-04-08 IMAGING — MR MR LUMBAR SPINE WO/W CM
4 of 8 series · 26 of 48 positions shown · IV contrast (GADAVIST)
Comparison: Prior MRI from [DATE].

CLINICAL DATA: Initial evaluation for low back extending into the
right lower extremity for 5 weeks. History of prior laminectomy with
micro discectomy.

EXAM:
MRI LUMBAR SPINE WITHOUT AND WITH CONTRAST
TECHNIQUE: Multiplanar and multiecho pulse sequences of the lumbar spine were
obtained without and with intravenous contrast.
CONTRAST:  5mL GADAVIST GADOBUTROL 1 MMOL/ML IV SOLN

[Series 3: T1 · sagittal · 4.0mm · 0.51mm/px · 4 of 13 slices shown (1 of 2)]
[im 1/13]
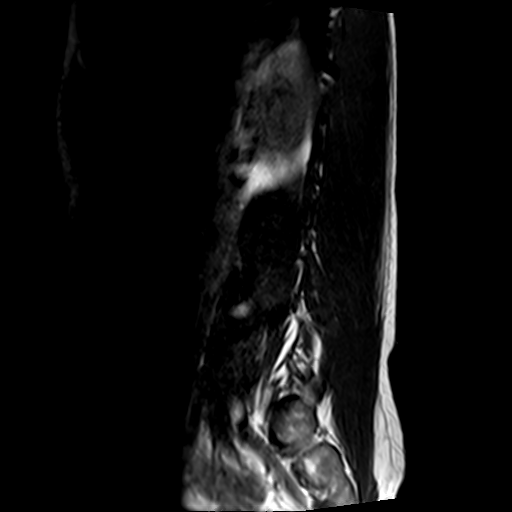
[im 5/13]
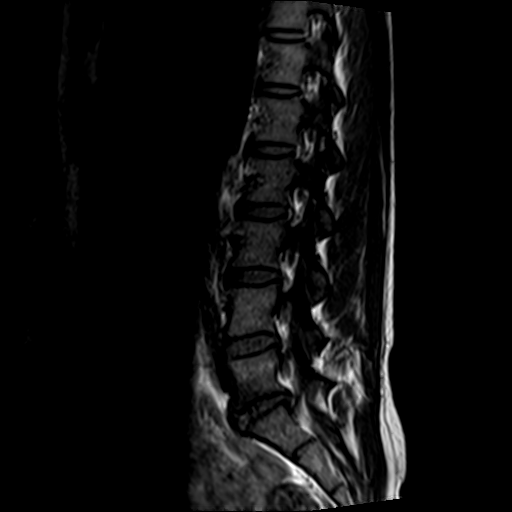
[im 9/13]
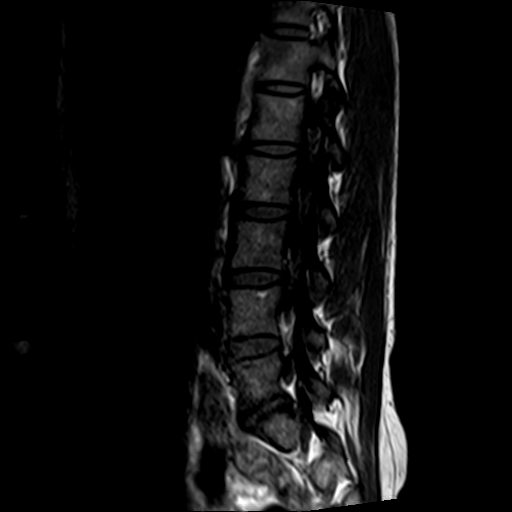
[im 13/13]
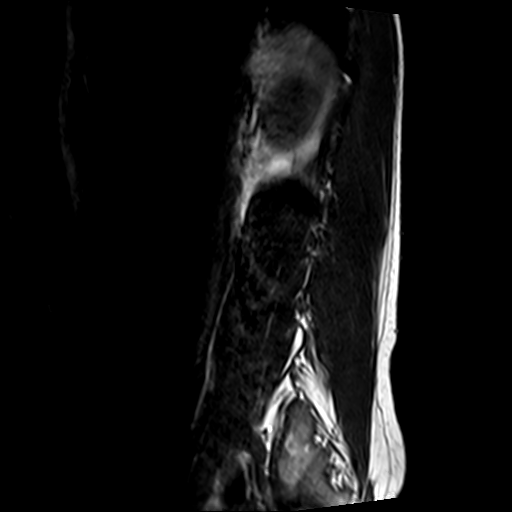

[Series 4: T2 · axial · 4.0mm · 0.39mm/px · z∈[-121,+64]mm · 9 of 34 slices shown (1 of 2)]
[im 1/34]
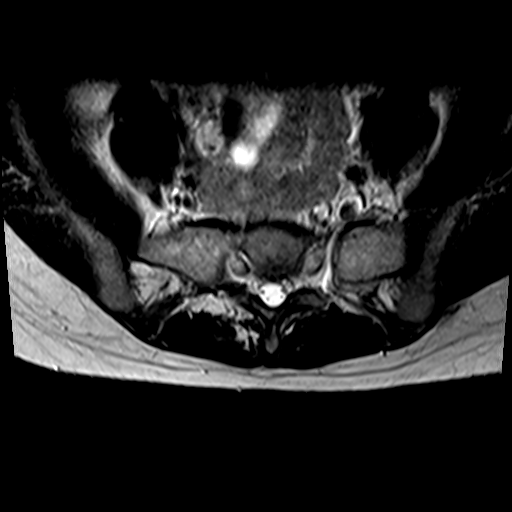
[im 5/34]
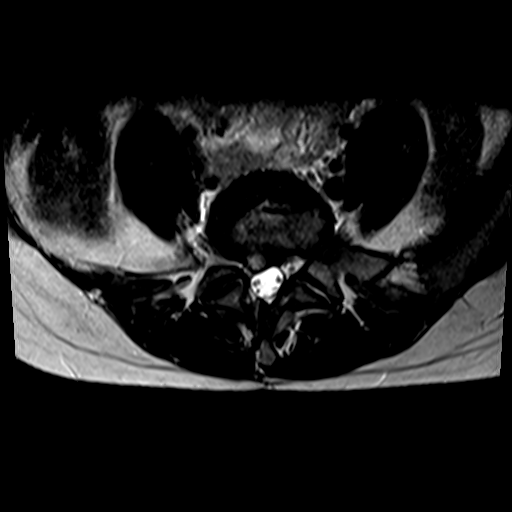
[im 9/34]
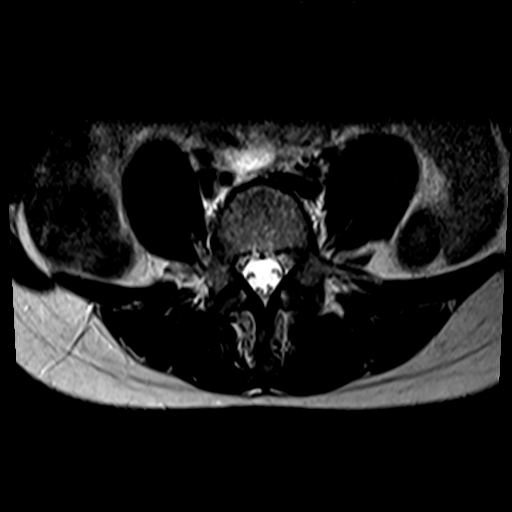
[im 13/34]
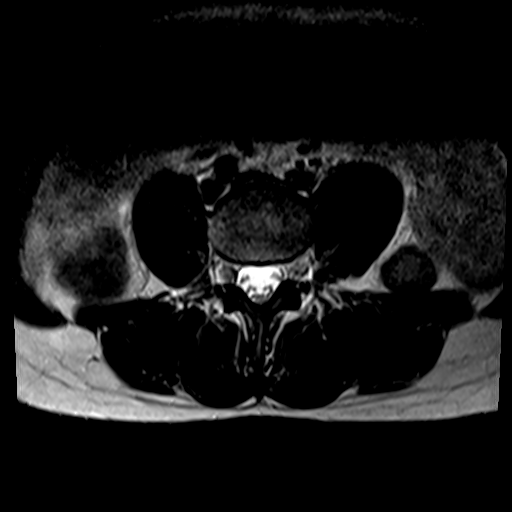
[im 17/34]
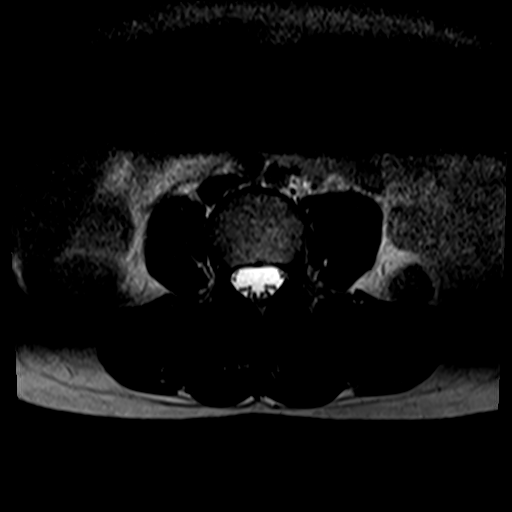
[im 21/34]
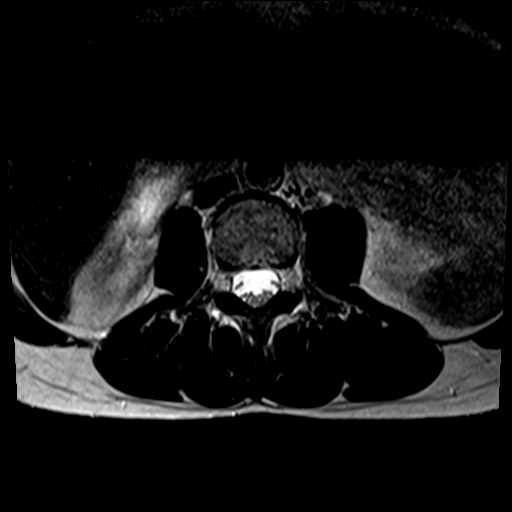
[im 25/34]
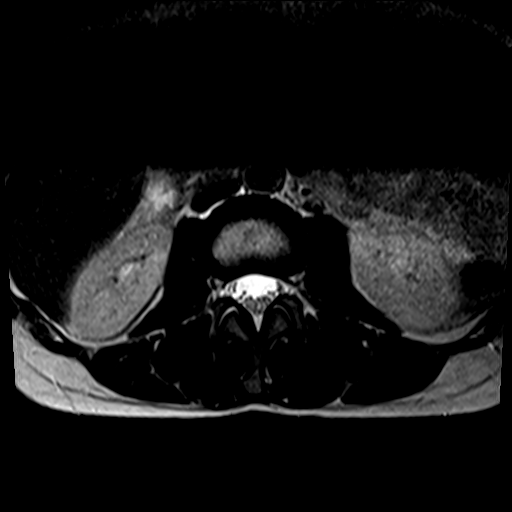
[im 29/34]
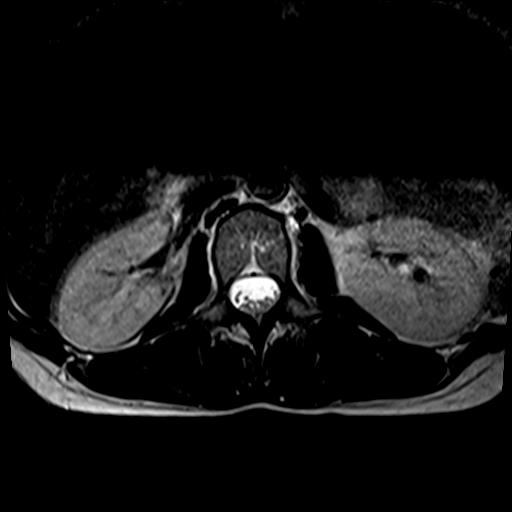
[im 34/34]
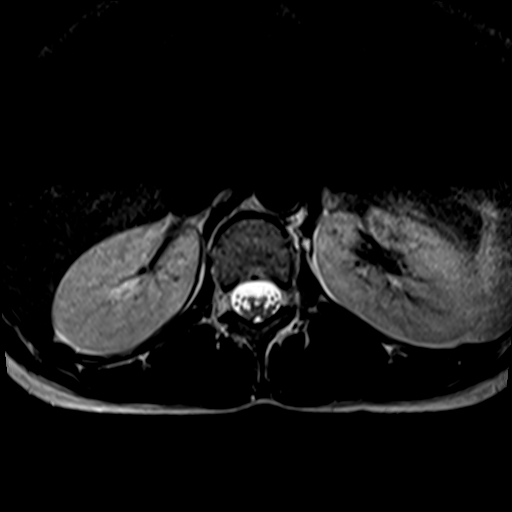

[Series 5: T1 · axial · 4.0mm · 0.78mm/px · z∈[-121,+64]mm · 9 of 34 slices shown (2 of 2)]
[im 1/34]
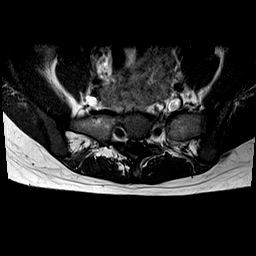
[im 5/34]
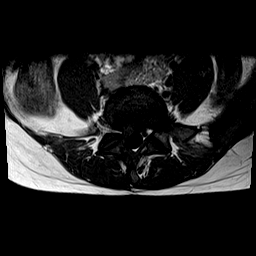
[im 9/34]
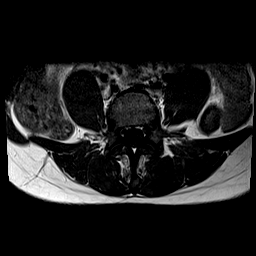
[im 13/34]
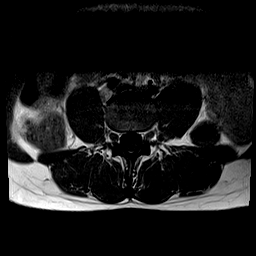
[im 17/34]
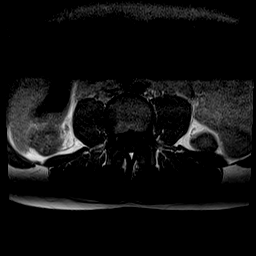
[im 21/34]
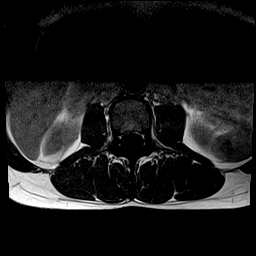
[im 25/34]
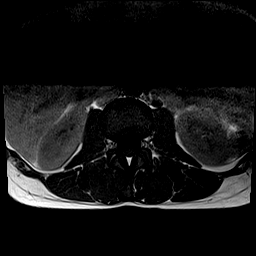
[im 29/34]
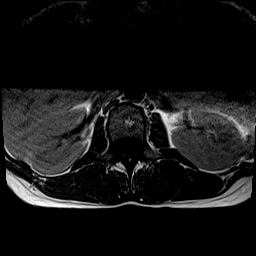
[im 34/34]
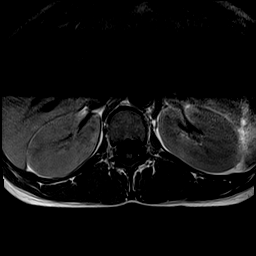

[Series 6: T2 · sagittal · 4.0mm · 0.81mm/px · 4 of 13 slices shown (2 of 2)]
[im 1/13]
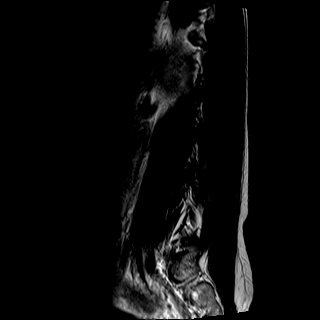
[im 5/13]
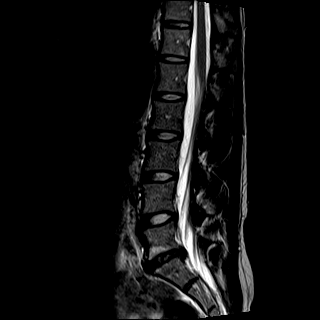
[im 9/13]
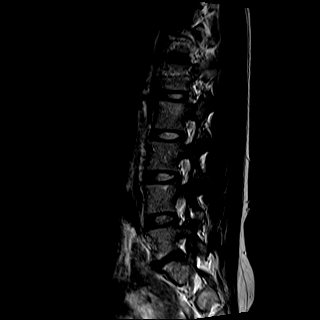
[im 13/13]
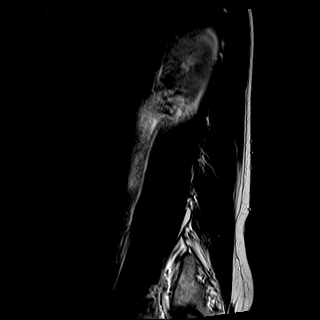

[26 of 48 positions shown; findings below may reference images not displayed]

FINDINGS: Segmentation: Standard. Lowest well-formed disc space labeled the
L5-S1 level.

Alignment: Trace dextroscoliosis. Trace retrolisthesis of L5 on S1,
stable.

Vertebrae: Vertebral body height maintained without evidence for
acute or chronic fracture. Bone marrow signal intensity within
normal limits. Few scattered benign hemangiomata noted. No worrisome
osseous lesions. Reactive endplate changes with associated marrow
edema and enhancement present about the L5-S1 interspace, likely
degenerative. No findings to suggest discitis osteomyelitis. No
other abnormal marrow edema.

Conus medullaris and cauda equina: Conus extends to the L1-2 level.
Conus and cauda equina appear normal.

Paraspinal and other soft tissues: Paraspinous soft tissues within
normal limits. Visualized visceral structures are normal.

Disc levels:

T11-12: Tiny right paracentral disc protrusion minimally indents the
right ventral thecal sac (series 6, image 8). Associated Peri discal
enhancement (series 7, image 8). No significant stenosis.

No other significant findings are seen through the L4-5 level.

L5-S1: Retrolisthesis. Diffuse disc bulge with disc desiccation and
intervertebral disc space narrowing. Reactive endplate changes.
Postoperative changes from right hemi laminectomy with micro
discectomy. 5 mm nonenhancing soft tissue density seen within the
right lateral recess consistent with a small residual and/or
recurrent disc protrusion (series 8, image 31). Enhancement within
the a surrounding right lateral recess and right lateral epidural
space most consistent with postoperative granulation tissue.
Persistent moderate right lateral recess stenosis with impingement
of the descending right S1 nerve root. Central canal remains patent.
No new foraminal stenosis.
IMPRESSION: 1. Postoperative changes from right hemi laminectomy with micro
discectomy at L5-S1. 5 mm nonenhancing soft tissue density within
the right lateral recess consistent with a small residual and/or
recurrent disc protrusion, resulting in moderate right lateral
recess stenosis with impingement of the descending right S1 nerve
root.
2. Tiny right paracentral disc protrusion at T11-12 without
stenosis.

## 2020-04-08 MED ORDER — GADOBUTROL 1 MMOL/ML IV SOLN
5.0000 mL | Freq: Once | INTRAVENOUS | Status: AC | PRN
  Administered 2020-04-08: 10:00:00 5 mL via INTRAVENOUS

## 2020-04-09 ENCOUNTER — Other Ambulatory Visit: Payer: Medicaid Other

## 2020-04-14 ENCOUNTER — Other Ambulatory Visit: Payer: Medicaid Other

## 2020-04-17 ENCOUNTER — Other Ambulatory Visit: Payer: Self-pay | Admitting: Neurosurgery

## 2020-04-23 NOTE — Pre-Procedure Instructions (Signed)
[id number] - [place], [place] - [address] [place] [address] Phone: [phone] Fax: [phone]     Your procedure is scheduled on Thursday April 29th.  Report to [facility] Main Entrance "A" at 12:00 PM., and check in at the Admitting office.  Call this number if you have problems the morning of surgery:  [phone]  Call [phone] if you have any questions prior to your surgery date Monday-Friday 8am-4pm    Remember:  Do not eat or drink after midnight the night before your surgery   Take these medicines the morning of surgery with A SIP OF WATER  gabapentin (NEURONTIN)    As of today, STOP taking any Aspirin (unless otherwise instructed by your surgeon) and Aspirin containing products, Aleve, Naproxen, Ibuprofen, Motrin, Advil, Goody's, BC's, all herbal medications, fish oil, and all vitamins.                      Do not wear jewelry, make up, or nail polish            Do not wear lotions, powders, perfumes/colognes, or deodorant.            Do not shave 48 hours prior to surgery.  Men may shave face and neck.            Do not bring valuables to the hospital.            [facility] is not responsible for any belongings or valuables.  Do NOT Smoke (Tobacco/Vapping) or drink Alcohol 24 hours prior to your procedure If you use a CPAP at night, you may bring all equipment for your overnight stay.   Contacts, glasses, dentures or bridgework may not be worn into surgery.      For patients admitted to the hospital, discharge time will be determined by your treatment team.   Patients discharged the day of surgery will not be allowed to drive home, and someone needs to stay with them for 24 hours.    Special instructions:   [place]- Preparing For Surgery  Before surgery, you can play an important role. Because skin is not sterile, your skin needs to be as free of germs as possible. You can reduce the  number of germs on your skin by washing with CHG (chlorahexidine gluconate) Soap before surgery.  CHG is an antiseptic cleaner which kills germs and bonds with the skin to continue killing germs even after washing.    Oral Hygiene is also important to reduce your risk of infection.  Remember - BRUSH YOUR TEETH THE MORNING OF SURGERY WITH YOUR REGULAR TOOTHPASTE  Please do not use if you have an allergy to CHG or antibacterial soaps. If your skin becomes reddened/irritated stop using the CHG.  Do not shave (including legs and underarms) for at least 48 hours prior to first CHG shower. It is OK to shave your face.  Please follow these instructions carefully.   1. Shower the NIGHT BEFORE SURGERY and the MORNING OF SURGERY with CHG Soap.   2. If you chose to wash your hair, wash your hair first as usual with your normal shampoo.  3. After you shampoo, rinse your hair and body thoroughly to remove the shampoo.  4. Use CHG as you would any other liquid soap. You can apply CHG directly to the skin and wash gently with a scrungie or a clean washcloth.  5. Apply the CHG Soap to your body ONLY FROM THE NECK DOWN.  Do not use on open wounds or open sores. Avoid contact with your eyes, ears, mouth and genitals (private parts). Wash Face and genitals (private parts)  with your normal soap.   6. Wash thoroughly, paying special attention to the area where your surgery will be performed.  7. Thoroughly rinse your body with warm water from the neck down.  8. DO NOT shower/wash with your normal soap after using and rinsing off the CHG Soap.  9. Pat yourself dry with a CLEAN TOWEL.  10. Wear CLEAN PAJAMAS to bed the night before surgery, wear comfortable clothes the morning of surgery  11. Place CLEAN SHEETS on your bed the night of your first shower and DO NOT SLEEP WITH PETS.   Day of Surgery:   Do not apply any deodorants/lotions.  Please wear clean clothes to the hospital/surgery center.    Remember to brush your teeth WITH YOUR REGULAR TOOTHPASTE.   Please read over the following fact sheets that you were given.

## 2020-04-24 ENCOUNTER — Other Ambulatory Visit (HOSPITAL_COMMUNITY)
Admission: RE | Admit: 2020-04-24 | Discharge: 2020-04-24 | Disposition: A | Discharge status: Home / Self Care | Payer: Medicaid Other | Source: Ambulatory Visit | Attending: Neurosurgery | Admitting: Neurosurgery

## 2020-04-24 ENCOUNTER — Inpatient Hospital Stay (HOSPITAL_COMMUNITY)
Admission: RE | Admit: 2020-04-24 | Discharge: 2020-04-24 | Disposition: A | Discharge status: Home / Self Care | Payer: Medicaid Other | Source: Ambulatory Visit

## 2020-04-24 DIAGNOSIS — Z20822 Contact with and (suspected) exposure to covid-19: Secondary | ICD-10-CM | POA: Diagnosis not present

## 2020-04-24 DIAGNOSIS — Z01812 Encounter for preprocedural laboratory examination: Secondary | ICD-10-CM | POA: Insufficient documentation

## 2020-04-24 LAB — SARS CORONAVIRUS 2 (TAT 6-24 HRS): SARS Coronavirus 2: NEGATIVE

## 2020-04-26 ENCOUNTER — Encounter (HOSPITAL_COMMUNITY): Admission: RE | Payer: Self-pay | Source: Home / Self Care

## 2020-04-26 ENCOUNTER — Ambulatory Visit (HOSPITAL_COMMUNITY): Admission: RE | Admit: 2020-04-26 | Payer: Medicaid Other | Source: Home / Self Care | Admitting: Neurosurgery

## 2020-04-26 SURGERY — LUMBAR LAMINECTOMY/DECOMPRESSION MICRODISCECTOMY 1 LEVEL
Anesthesia: General | Laterality: Right

## 2020-05-02 ENCOUNTER — Other Ambulatory Visit: Payer: Self-pay | Admitting: Neurosurgery

## 2020-05-07 ENCOUNTER — Other Ambulatory Visit (HOSPITAL_COMMUNITY)
Admission: RE | Admit: 2020-05-07 | Discharge: 2020-05-07 | Disposition: A | Discharge status: Home / Self Care | Payer: Medicaid Other | Source: Ambulatory Visit | Attending: Neurosurgery | Admitting: Neurosurgery

## 2020-05-07 DIAGNOSIS — Z01812 Encounter for preprocedural laboratory examination: Secondary | ICD-10-CM | POA: Diagnosis not present

## 2020-05-07 DIAGNOSIS — Z20822 Contact with and (suspected) exposure to covid-19: Secondary | ICD-10-CM | POA: Diagnosis not present

## 2020-05-07 LAB — SARS CORONAVIRUS 2 (TAT 6-24 HRS): SARS Coronavirus 2: NEGATIVE

## 2020-05-08 ENCOUNTER — Encounter (HOSPITAL_COMMUNITY): Payer: Self-pay | Admitting: Neurosurgery

## 2020-05-09 ENCOUNTER — Ambulatory Visit (HOSPITAL_COMMUNITY): Payer: Medicaid Other | Admitting: Certified Registered"

## 2020-05-09 ENCOUNTER — Encounter (HOSPITAL_COMMUNITY): Payer: Self-pay | Admitting: Neurosurgery

## 2020-05-09 ENCOUNTER — Encounter (HOSPITAL_COMMUNITY)
Admission: RE | Disposition: A | Discharge status: Home / Self Care | Payer: Self-pay | Source: Home / Self Care | Attending: Neurosurgery

## 2020-05-09 ENCOUNTER — Observation Stay (HOSPITAL_COMMUNITY)
Admission: RE | Admit: 2020-05-09 | Discharge: 2020-05-10 | Disposition: A | Discharge status: Home / Self Care | Payer: Medicaid Other | Attending: Neurosurgery | Admitting: Neurosurgery

## 2020-05-09 ENCOUNTER — Ambulatory Visit (HOSPITAL_COMMUNITY): Payer: Medicaid Other

## 2020-05-09 ENCOUNTER — Other Ambulatory Visit: Payer: Self-pay

## 2020-05-09 DIAGNOSIS — M5127 Other intervertebral disc displacement, lumbosacral region: Principal | ICD-10-CM | POA: Insufficient documentation

## 2020-05-09 DIAGNOSIS — Z87891 Personal history of nicotine dependence: Secondary | ICD-10-CM | POA: Insufficient documentation

## 2020-05-09 DIAGNOSIS — Z803 Family history of malignant neoplasm of breast: Secondary | ICD-10-CM | POA: Insufficient documentation

## 2020-05-09 DIAGNOSIS — Z419 Encounter for procedure for purposes other than remedying health state, unspecified: Secondary | ICD-10-CM

## 2020-05-09 DIAGNOSIS — M5126 Other intervertebral disc displacement, lumbar region: Secondary | ICD-10-CM | POA: Diagnosis present

## 2020-05-09 HISTORY — PX: LUMBAR LAMINECTOMY/DECOMPRESSION MICRODISCECTOMY: SHX5026

## 2020-05-09 HISTORY — DX: Pneumonia, unspecified organism: J18.9

## 2020-05-09 LAB — CBC
HCT: 33.5 % — ABNORMAL LOW (ref 36.0–46.0)
Hemoglobin: 10.2 g/dL — ABNORMAL LOW (ref 12.0–15.0)
MCH: 25.2 pg — ABNORMAL LOW (ref 26.0–34.0)
MCHC: 30.4 g/dL (ref 30.0–36.0)
MCV: 82.9 fL (ref 80.0–100.0)
Platelets: 492 10*3/uL — ABNORMAL HIGH (ref 150–400)
RBC: 4.04 MIL/uL (ref 3.87–5.11)
RDW: 14.8 % (ref 11.5–15.5)
WBC: 8.7 10*3/uL (ref 4.0–10.5)
nRBC: 0 % (ref 0.0–0.2)

## 2020-05-09 LAB — POCT PREGNANCY, URINE: Preg Test, Ur: NEGATIVE

## 2020-05-09 SURGERY — LUMBAR LAMINECTOMY/DECOMPRESSION MICRODISCECTOMY 1 LEVEL
Anesthesia: General | Site: Spine Lumbar

## 2020-05-09 MED ORDER — DIAZEPAM 5 MG PO TABS
ORAL_TABLET | ORAL | Status: AC
  Filled 2020-05-09: qty 1

## 2020-05-09 MED ORDER — CEFAZOLIN SODIUM-DEXTROSE 2-4 GM/100ML-% IV SOLN
2.0000 g | INTRAVENOUS | Status: AC
  Administered 2020-05-09: 2 g via INTRAVENOUS

## 2020-05-09 MED ORDER — HYDROMORPHONE HCL 1 MG/ML IJ SOLN
0.5000 mg | INTRAMUSCULAR | Status: DC | PRN
  Administered 2020-05-09 (×2): 0.5 mg via INTRAVENOUS

## 2020-05-09 MED ORDER — SUGAMMADEX SODIUM 200 MG/2ML IV SOLN
INTRAVENOUS | Status: DC | PRN
  Administered 2020-05-09: 100 mg via INTRAVENOUS

## 2020-05-09 MED ORDER — HYDROMORPHONE HCL 1 MG/ML IJ SOLN
0.2500 mg | INTRAMUSCULAR | Status: DC | PRN
  Administered 2020-05-09: 0.5 mg via INTRAVENOUS

## 2020-05-09 MED ORDER — DEXMEDETOMIDINE HCL IN NACL 200 MCG/50ML IV SOLN
INTRAVENOUS | Status: DC | PRN
Start: 2020-05-09 — End: 2020-05-09
  Administered 2020-05-09: 4 ug via INTRAVENOUS
  Administered 2020-05-09: 8 ug via INTRAVENOUS

## 2020-05-09 MED ORDER — ONDANSETRON HCL 4 MG PO TABS: 4.0000 mg | ORAL_TABLET | Freq: Four times a day (QID) | ORAL | Status: DC | PRN

## 2020-05-09 MED ORDER — THROMBIN 5000 UNITS EX SOLR
CUTANEOUS | Status: DC | PRN
  Administered 2020-05-09 (×2): 5000 [IU] via TOPICAL

## 2020-05-09 MED ORDER — FENTANYL CITRATE (PF) 100 MCG/2ML IJ SOLN
INTRAMUSCULAR | Status: AC
  Filled 2020-05-09: qty 2

## 2020-05-09 MED ORDER — SODIUM CHLORIDE 0.9 % IV SOLN: 250.0000 mL | INTRAVENOUS | Status: DC

## 2020-05-09 MED ORDER — OXYCODONE HCL 5 MG PO TABS
10.0000 mg | ORAL_TABLET | ORAL | Status: DC | PRN
  Administered 2020-05-09 – 2020-05-10 (×5): 10 mg via ORAL
  Filled 2020-05-09 (×5): qty 2

## 2020-05-09 MED ORDER — LIDOCAINE 2% (20 MG/ML) 5 ML SYRINGE
INTRAMUSCULAR | Status: DC | PRN
  Administered 2020-05-09: 100 mg via INTRAVENOUS

## 2020-05-09 MED ORDER — KETAMINE HCL 50 MG/5ML IJ SOSY
PREFILLED_SYRINGE | INTRAMUSCULAR | Status: AC
  Filled 2020-05-09: qty 5

## 2020-05-09 MED ORDER — BUPIVACAINE HCL (PF) 0.5 % IJ SOLN
INTRAMUSCULAR | Status: AC
  Filled 2020-05-09: qty 30

## 2020-05-09 MED ORDER — PHENYLEPHRINE 40 MCG/ML (10ML) SYRINGE FOR IV PUSH (FOR BLOOD PRESSURE SUPPORT)
PREFILLED_SYRINGE | INTRAVENOUS | Status: AC
  Filled 2020-05-09: qty 10

## 2020-05-09 MED ORDER — ONDANSETRON HCL 4 MG/2ML IJ SOLN
INTRAMUSCULAR | Status: DC | PRN
  Administered 2020-05-09: 4 mg via INTRAVENOUS

## 2020-05-09 MED ORDER — BISACODYL 5 MG PO TBEC: 5.0000 mg | DELAYED_RELEASE_TABLET | Freq: Every day | ORAL | Status: DC | PRN

## 2020-05-09 MED ORDER — CEFAZOLIN SODIUM-DEXTROSE 2-4 GM/100ML-% IV SOLN
INTRAVENOUS | Status: AC
  Filled 2020-05-09: qty 100

## 2020-05-09 MED ORDER — METHYLPREDNISOLONE ACETATE 80 MG/ML IJ SUSP
INTRAMUSCULAR | Status: DC | PRN
  Administered 2020-05-09: 80 mg

## 2020-05-09 MED ORDER — HYDROMORPHONE HCL 1 MG/ML IJ SOLN
INTRAMUSCULAR | Status: AC
  Filled 2020-05-09: qty 1

## 2020-05-09 MED ORDER — LIDOCAINE-EPINEPHRINE 0.5 %-1:200000 IJ SOLN
INTRAMUSCULAR | Status: AC
  Filled 2020-05-09: qty 1

## 2020-05-09 MED ORDER — CHLORHEXIDINE GLUCONATE CLOTH 2 % EX PADS: 6.0000 | MEDICATED_PAD | Freq: Once | CUTANEOUS | Status: DC

## 2020-05-09 MED ORDER — ACETAMINOPHEN 650 MG RE SUPP: 650.0000 mg | RECTAL | Status: DC | PRN

## 2020-05-09 MED ORDER — FENTANYL CITRATE (PF) 100 MCG/2ML IJ SOLN
INTRAMUSCULAR | Status: DC | PRN
  Administered 2020-05-09: 150 ug via INTRAVENOUS

## 2020-05-09 MED ORDER — ONDANSETRON HCL 4 MG/2ML IJ SOLN: 4.0000 mg | Freq: Once | INTRAMUSCULAR | Status: DC | PRN

## 2020-05-09 MED ORDER — POTASSIUM CHLORIDE IN NACL 20-0.9 MEQ/L-% IV SOLN: INTRAVENOUS | Status: DC

## 2020-05-09 MED ORDER — OXYCODONE HCL 5 MG/5ML PO SOLN: 5.0000 mg | Freq: Once | ORAL | Status: DC | PRN

## 2020-05-09 MED ORDER — PROPOFOL 10 MG/ML IV BOLUS
INTRAVENOUS | Status: DC | PRN
  Administered 2020-05-09: 200 mg via INTRAVENOUS

## 2020-05-09 MED ORDER — ROCURONIUM BROMIDE 10 MG/ML (PF) SYRINGE
PREFILLED_SYRINGE | INTRAVENOUS | Status: DC | PRN
  Administered 2020-05-09: 70 mg via INTRAVENOUS

## 2020-05-09 MED ORDER — PHENYLEPHRINE 40 MCG/ML (10ML) SYRINGE FOR IV PUSH (FOR BLOOD PRESSURE SUPPORT)
PREFILLED_SYRINGE | INTRAVENOUS | Status: DC | PRN
  Administered 2020-05-09: 80 ug via INTRAVENOUS

## 2020-05-09 MED ORDER — MIDAZOLAM HCL 2 MG/2ML IJ SOLN
INTRAMUSCULAR | Status: DC | PRN
  Administered 2020-05-09: 2 mg via INTRAVENOUS

## 2020-05-09 MED ORDER — PHENOL 1.4 % MT LIQD: 1.0000 | OROMUCOSAL | Status: DC | PRN

## 2020-05-09 MED ORDER — BUPIVACAINE HCL (PF) 0.5 % IJ SOLN
INTRAMUSCULAR | Status: DC | PRN
  Administered 2020-05-09: 14 mL

## 2020-05-09 MED ORDER — 0.9 % SODIUM CHLORIDE (POUR BTL) OPTIME
TOPICAL | Status: DC | PRN
  Administered 2020-05-09: 1000 mL

## 2020-05-09 MED ORDER — ZOLPIDEM TARTRATE 5 MG PO TABS
5.0000 mg | ORAL_TABLET | Freq: Every evening | ORAL | Status: DC | PRN
  Administered 2020-05-09: 5 mg via ORAL
  Filled 2020-05-09: qty 1

## 2020-05-09 MED ORDER — MORPHINE SULFATE (PF) 2 MG/ML IV SOLN: 2.0000 mg | INTRAVENOUS | Status: DC | PRN

## 2020-05-09 MED ORDER — MENTHOL 3 MG MT LOZG: 1.0000 | LOZENGE | OROMUCOSAL | Status: DC | PRN

## 2020-05-09 MED ORDER — SODIUM CHLORIDE 0.9% FLUSH
3.0000 mL | Freq: Two times a day (BID) | INTRAVENOUS | Status: DC
  Administered 2020-05-09: 3 mL via INTRAVENOUS

## 2020-05-09 MED ORDER — KETAMINE HCL 10 MG/ML IJ SOLN
INTRAMUSCULAR | Status: DC | PRN
Start: 2020-05-09 — End: 2020-05-09
  Administered 2020-05-09 (×2): 10 mg via INTRAVENOUS

## 2020-05-09 MED ORDER — DEXAMETHASONE SODIUM PHOSPHATE 10 MG/ML IJ SOLN
INTRAMUSCULAR | Status: AC
  Filled 2020-05-09: qty 1

## 2020-05-09 MED ORDER — ACETAMINOPHEN 325 MG PO TABS
650.0000 mg | ORAL_TABLET | ORAL | Status: DC | PRN
  Administered 2020-05-09 – 2020-05-10 (×2): 650 mg via ORAL
  Filled 2020-05-09 (×2): qty 2

## 2020-05-09 MED ORDER — METHYLPREDNISOLONE ACETATE 80 MG/ML IJ SUSP
INTRAMUSCULAR | Status: AC
  Filled 2020-05-09: qty 1

## 2020-05-09 MED ORDER — PROPOFOL 10 MG/ML IV BOLUS
INTRAVENOUS | Status: AC
  Filled 2020-05-09: qty 20

## 2020-05-09 MED ORDER — SODIUM CHLORIDE 0.9% FLUSH: 3.0000 mL | INTRAVENOUS | Status: DC | PRN

## 2020-05-09 MED ORDER — MAGNESIUM CITRATE PO SOLN: 1.0000 | Freq: Once | ORAL | Status: DC | PRN

## 2020-05-09 MED ORDER — FENTANYL CITRATE (PF) 250 MCG/5ML IJ SOLN
INTRAMUSCULAR | Status: AC
  Filled 2020-05-09: qty 5

## 2020-05-09 MED ORDER — LIDOCAINE 2% (20 MG/ML) 5 ML SYRINGE
INTRAMUSCULAR | Status: AC
  Filled 2020-05-09: qty 5

## 2020-05-09 MED ORDER — OXYCODONE HCL 5 MG PO TABS: 5.0000 mg | ORAL_TABLET | Freq: Once | ORAL | Status: DC | PRN

## 2020-05-09 MED ORDER — LIDOCAINE-EPINEPHRINE 0.5 %-1:200000 IJ SOLN
INTRAMUSCULAR | Status: DC | PRN
  Administered 2020-05-09: 2 mL

## 2020-05-09 MED ORDER — HEMOSTATIC AGENTS (NO CHARGE) OPTIME
TOPICAL | Status: DC | PRN
  Administered 2020-05-09: 1 via TOPICAL

## 2020-05-09 MED ORDER — ONDANSETRON HCL 4 MG/2ML IJ SOLN
INTRAMUSCULAR | Status: AC
  Filled 2020-05-09: qty 2

## 2020-05-09 MED ORDER — HYDROCODONE-ACETAMINOPHEN 10-325 MG PO TABS: 1.0000 | ORAL_TABLET | ORAL | Status: DC | PRN

## 2020-05-09 MED ORDER — FENTANYL CITRATE (PF) 100 MCG/2ML IJ SOLN
INTRAMUSCULAR | Status: DC | PRN
  Administered 2020-05-09: 100 ug via INTRAVENOUS

## 2020-05-09 MED ORDER — LACTATED RINGERS IV SOLN: INTRAVENOUS | Status: DC

## 2020-05-09 MED ORDER — ONDANSETRON HCL 4 MG/2ML IJ SOLN: 4.0000 mg | Freq: Four times a day (QID) | INTRAMUSCULAR | Status: DC | PRN

## 2020-05-09 MED ORDER — SENNOSIDES-DOCUSATE SODIUM 8.6-50 MG PO TABS: 1.0000 | ORAL_TABLET | Freq: Every evening | ORAL | Status: DC | PRN

## 2020-05-09 MED ORDER — THROMBIN 5000 UNITS EX SOLR
CUTANEOUS | Status: AC
  Filled 2020-05-09: qty 10000

## 2020-05-09 MED ORDER — GABAPENTIN 300 MG PO CAPS
300.0000 mg | ORAL_CAPSULE | Freq: Three times a day (TID) | ORAL | Status: DC
  Administered 2020-05-09 – 2020-05-10 (×3): 300 mg via ORAL
  Filled 2020-05-09 (×3): qty 1

## 2020-05-09 MED ORDER — DOCUSATE SODIUM 100 MG PO CAPS
100.0000 mg | ORAL_CAPSULE | Freq: Two times a day (BID) | ORAL | Status: DC
  Administered 2020-05-09 – 2020-05-10 (×2): 100 mg via ORAL
  Filled 2020-05-09 (×2): qty 1

## 2020-05-09 MED ORDER — KETOROLAC TROMETHAMINE 15 MG/ML IJ SOLN
15.0000 mg | Freq: Four times a day (QID) | INTRAMUSCULAR | Status: DC
  Administered 2020-05-09 – 2020-05-10 (×3): 15 mg via INTRAVENOUS
  Filled 2020-05-09 (×3): qty 1

## 2020-05-09 MED ORDER — DIAZEPAM 5 MG PO TABS
5.0000 mg | ORAL_TABLET | Freq: Four times a day (QID) | ORAL | Status: DC | PRN
  Administered 2020-05-09 – 2020-05-10 (×3): 5 mg via ORAL
  Filled 2020-05-09 (×3): qty 1

## 2020-05-09 MED ORDER — ROCURONIUM BROMIDE 10 MG/ML (PF) SYRINGE
PREFILLED_SYRINGE | INTRAVENOUS | Status: AC
  Filled 2020-05-09: qty 10

## 2020-05-09 MED ORDER — DEXAMETHASONE SODIUM PHOSPHATE 10 MG/ML IJ SOLN
INTRAMUSCULAR | Status: DC | PRN
  Administered 2020-05-09: 5 mg via INTRAVENOUS

## 2020-05-09 MED ORDER — MIDAZOLAM HCL 2 MG/2ML IJ SOLN
INTRAMUSCULAR | Status: AC
  Filled 2020-05-09: qty 2

## 2020-05-09 SURGICAL SUPPLY — 54 items
BAND RUBBER #18 3X1/16 STRL (MISCELLANEOUS) ×6 IMPLANT
BENZOIN TINCTURE PRP APPL 2/3 (GAUZE/BANDAGES/DRESSINGS) IMPLANT
BLADE CLIPPER SURG (BLADE) IMPLANT
BUR MATCHSTICK NEURO 3.0 LAGG (BURR) ×3 IMPLANT
BUR PRECISION FLUTE 5.0 (BURR) ×3 IMPLANT
CANISTER SUCT 3000ML PPV (MISCELLANEOUS) ×3 IMPLANT
CARTRIDGE OIL MAESTRO DRILL (MISCELLANEOUS) ×1 IMPLANT
CLOSURE WOUND 1/2 X4 (GAUZE/BANDAGES/DRESSINGS)
COVER WAND RF STERILE (DRAPES) IMPLANT
DECANTER SPIKE VIAL GLASS SM (MISCELLANEOUS) ×3 IMPLANT
DERMABOND ADVANCED (GAUZE/BANDAGES/DRESSINGS) ×2
DERMABOND ADVANCED .7 DNX12 (GAUZE/BANDAGES/DRESSINGS) ×1 IMPLANT
DIFFUSER DRILL AIR PNEUMATIC (MISCELLANEOUS) ×3 IMPLANT
DRAPE LAPAROTOMY 100X72X124 (DRAPES) ×3 IMPLANT
DRAPE MICROSCOPE LEICA (MISCELLANEOUS) ×3 IMPLANT
DRAPE SURG 17X23 STRL (DRAPES) ×3 IMPLANT
DURAPREP 26ML APPLICATOR (WOUND CARE) ×3 IMPLANT
ELECT REM PT RETURN 9FT ADLT (ELECTROSURGICAL) ×3
ELECTRODE REM PT RTRN 9FT ADLT (ELECTROSURGICAL) ×1 IMPLANT
GAUZE 4X4 16PLY RFD (DISPOSABLE) IMPLANT
GAUZE SPONGE 4X4 12PLY STRL (GAUZE/BANDAGES/DRESSINGS) IMPLANT
GLOVE BIOGEL PI IND STRL 6.5 (GLOVE) ×1 IMPLANT
GLOVE BIOGEL PI IND STRL 8 (GLOVE) ×1 IMPLANT
GLOVE BIOGEL PI INDICATOR 6.5 (GLOVE) ×2
GLOVE BIOGEL PI INDICATOR 8 (GLOVE) ×2
GLOVE ECLIPSE 6.5 STRL STRAW (GLOVE) ×3 IMPLANT
GLOVE EXAM NITRILE XL STR (GLOVE) IMPLANT
GLOVE SURG SS PI 6.0 STRL IVOR (GLOVE) ×6 IMPLANT
GLOVE SURG SS PI 8.0 STRL IVOR (GLOVE) ×3 IMPLANT
GOWN STRL REUS W/ TWL LRG LVL3 (GOWN DISPOSABLE) ×2 IMPLANT
GOWN STRL REUS W/ TWL XL LVL3 (GOWN DISPOSABLE) ×1 IMPLANT
GOWN STRL REUS W/TWL 2XL LVL3 (GOWN DISPOSABLE) IMPLANT
GOWN STRL REUS W/TWL LRG LVL3 (GOWN DISPOSABLE) ×6
GOWN STRL REUS W/TWL XL LVL3 (GOWN DISPOSABLE) ×3
KIT BASIN OR (CUSTOM PROCEDURE TRAY) ×3 IMPLANT
KIT TURNOVER KIT B (KITS) ×3 IMPLANT
NEEDLE HYPO 18GX1.5 BLUNT FILL (NEEDLE) ×3 IMPLANT
NEEDLE HYPO 25X1 1.5 SAFETY (NEEDLE) ×3 IMPLANT
NEEDLE SPNL 18GX3.5 QUINCKE PK (NEEDLE) ×3 IMPLANT
NS IRRIG 1000ML POUR BTL (IV SOLUTION) ×3 IMPLANT
OIL CARTRIDGE MAESTRO DRILL (MISCELLANEOUS) ×3
PACK LAMINECTOMY NEURO (CUSTOM PROCEDURE TRAY) ×3 IMPLANT
PAD ARMBOARD 7.5X6 YLW CONV (MISCELLANEOUS) ×15 IMPLANT
SPONGE LAP 4X18 RFD (DISPOSABLE) IMPLANT
SPONGE SURGIFOAM ABS GEL SZ50 (HEMOSTASIS) ×3 IMPLANT
STRIP CLOSURE SKIN 1/2X4 (GAUZE/BANDAGES/DRESSINGS) IMPLANT
SUT VIC AB 0 CT1 18XCR BRD8 (SUTURE) ×1 IMPLANT
SUT VIC AB 0 CT1 8-18 (SUTURE) ×3
SUT VIC AB 2-0 CT1 18 (SUTURE) ×3 IMPLANT
SUT VIC AB 3-0 SH 8-18 (SUTURE) ×3 IMPLANT
SYR 5ML LL (SYRINGE) ×3 IMPLANT
TOWEL GREEN STERILE (TOWEL DISPOSABLE) ×3 IMPLANT
TOWEL GREEN STERILE FF (TOWEL DISPOSABLE) ×3 IMPLANT
WATER STERILE IRR 1000ML POUR (IV SOLUTION) ×3 IMPLANT

## 2020-05-09 NOTE — H&P (Signed)
Catherine Mullen returns today with the MRI of the lumbar spine.  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  This is the reason for the discomfort and the pain.  We discussed today what she needs to do after this operation.  She freely admitted that she went back to work after 1 week.  She said she felt good and thought she should just be able to work.  That did not work out for her.  That doesn't mean that the recurrence is her fault, but she is just going to have to take it easy longer for this to fully heal. No Known Allergies Past Medical History:  Diagnosis Date  . Pneumonia    Past Surgical History:  Procedure Laterality Date  . LUMBAR LAMINECTOMY/DECOMPRESSION MICRODISCECTOMY Right 10/06/2019   Procedure: Right Lumbar Five- Sacral One Microdiscectomy;  Surgeon: Coletta Memos, MD;  Location: [facility];  Service: Neurosurgery;  Laterality: Right;  Right Lumbar Five- Sacral One Microdiscectomy  . OVARIAN CYST SURGERY    . TUBAL LIGATION     Family History  Problem Relation Age of Onset  . Cancer Mother        Breast  . Healthy Sister   . Healthy Brother   . Healthy Brother    Social History   Socioeconomic History  . Marital status: Single    Spouse name: Not on file  . Number of children: Not on file  . Years of education: Not on file  . Highest education level: Not on file  Occupational History  . Not on file  Tobacco Use  . Smoking status: Former Smoker    Types: Cigarettes    Quit date: 12/29/2012    Years since quitting: 7.3  . Smokeless tobacco: Never Used  Substance and Sexual Activity  . Alcohol use: Never  . Drug use: Yes    Types: Marijuana    Comment: 5-6X/daily  . Sexual activity: Not on file  Other Topics Concern  . Not on file  Social History Narrative  . Not on file   Social Determinants of Health   Financial Resource Strain:   . Difficulty of Paying Living Expenses:   Food Insecurity:   . Worried About Programme researcher, broadcasting/film/video in the Last Year:   .  Barista in the Last Year:   Transportation Needs:   . Freight forwarder (Medical):   Marland Kitchen Lack of Transportation (Non-Medical):   Physical Activity:   . Days of Exercise per Week:   . Minutes of Exercise per Session:   Stress:   . Feeling of Stress :   Social Connections:   . Frequency of Communication with Friends and Family:   . Frequency of Social Gatherings with Friends and Family:   . Attends Religious Services:   . Active Member of Clubs or Organizations:   . Attends Banker Meetings:   Marland Kitchen Marital Status:   Intimate Partner Violence:   . Fear of Current or Ex-Partner:   . Emotionally Abused:   Marland Kitchen Physically Abused:   . Sexually Abused:    Prior to Admission medications   Medication Sig Start Date End Date Taking? Authorizing Provider  gabapentin (NEURONTIN) 300 MG capsule Take 300 mg by mouth 3 (three) times daily.   Yes [provider]  ibuprofen (ADVIL) 600 MG tablet Take 600 mg by mouth every 4 (four) hours as needed for headache or moderate pain.  03/31/20  Yes [provider]  famotidine (PEPCID) 20 MG tablet Take 1 tablet (20 mg total) by mouth 2 (two) times daily. Patient not taking: Reported on 04/17/2020 11/30/19   Catherine Eagles, PA-C        She is 61 inches, weighs 108 pounds.  Blood pressure is 110/79, pulse is 98, temperature is 97.1. Pain is 7/10. Alert, oriented by 4, answering all questions appropriately.  Memory, language, attention span, and fund of knowledge are normal.  Speech is clear. It is also fluent. Hearing intact to voice.  Muscle tone, bulk, coordination are normal.   Ht 5\' 1"  (1.549 m)   Wt 49.4 kg   LMP 05/08/2020   BMI 20.60 kg/m  Catherine Mullen has decided to undergo a lumbar discetomy/decompression for a disc herniation at levels L5/S1. Risks and benefits including but not limited to bleeding, infection, paralysis, weakness in one or both extremities, bowel and/or bladder dysfunction, need for further  surgery, no relief of pain. [name] understands and wishes to proceed.

## 2020-05-09 NOTE — Anesthesia Postprocedure Evaluation (Signed)
Anesthesia Post Note  Patient: Catherine Mullen  Procedure(s) Performed: Right Lumbar Five Sacral One Microdiscectomy (N/A Spine Lumbar)     Patient location during evaluation: PACU Anesthesia Type: General Level of consciousness: awake and alert Pain management: pain level controlled Vital Signs Assessment: post-procedure vital signs reviewed and stable Respiratory status: spontaneous breathing, nonlabored ventilation and respiratory function stable Cardiovascular status: blood pressure returned to baseline and stable Postop Assessment: no apparent nausea or vomiting Anesthetic complications: no    Last Vitals:  Vitals:   05/09/20 1615 05/09/20 1652  BP: 109/79 124/89  Pulse: 82 87  Resp: 15 18  Temp: 36.6 C 36.7 C  SpO2: 99% 100%    Last Pain:  Vitals:   05/09/20 1652  TempSrc: Oral  PainSc:                  Catherine Mullen

## 2020-05-09 NOTE — Anesthesia Preprocedure Evaluation (Addendum)
Anesthesia Evaluation  Patient identified by MRN, date of birth, ID band Patient awake    Reviewed: Allergy & Precautions, NPO status , Patient's Chart, lab work & pertinent test results  History of Anesthesia Complications Negative for: history of anesthetic complications  Airway Mallampati: I  TM Distance: >3 FB Neck ROM: Full    Dental  (+) Teeth Intact   Pulmonary neg pulmonary ROS, Patient abstained from smoking., former smoker,    Pulmonary exam normal        Cardiovascular negative cardio ROS Normal cardiovascular exam     Neuro/Psych negative neurological ROS  negative psych ROS   GI/Hepatic negative GI ROS, (+)     substance abuse  marijuana use,   Endo/Other  negative endocrine ROS  Renal/GU negative Renal ROS  negative genitourinary   Musculoskeletal negative musculoskeletal ROS (+)   Abdominal   Peds  Hematology negative hematology ROS (+)   Anesthesia Other Findings   Reproductive/Obstetrics                            Anesthesia Physical Anesthesia Plan  ASA: II  Anesthesia Plan: General   Post-op Pain Management:    Induction: Intravenous  PONV Risk Score and Plan: 3 and Ondansetron, Dexamethasone, Treatment may vary due to age or medical condition and Midazolam  Airway Management Planned: Oral ETT  Additional Equipment: None  Intra-op Plan:   Post-operative Plan: Extubation in OR  Informed Consent: I have reviewed the patients History and Physical, chart, labs and discussed the procedure including the risks, benefits and alternatives for the proposed anesthesia with the patient or authorized representative who has indicated his/her understanding and acceptance.     Dental advisory given  Plan Discussed with:   Anesthesia Plan Comments:         Anesthesia Quick Evaluation

## 2020-05-09 NOTE — Transfer of Care (Signed)
Immediate Anesthesia Transfer of Care Note  Patient: Catherine Mullen  Procedure(s) Performed: Right Lumbar Five Sacral One Microdiscectomy (N/A Spine Lumbar)  Patient Location: PACU  Anesthesia Type:General  Level of Consciousness: awake and oriented  Airway & Oxygen Therapy: Patient Spontanous Breathing  Post-op Assessment: Report given to RN and Patient moving all extremities X 4  Post vital signs: Reviewed and stable  Last Vitals:  Vitals Value Taken Time  BP    Temp    Pulse    Resp    SpO2      Last Pain:  Vitals:   05/09/20 1052  TempSrc:   PainSc: 5       Patients Stated Pain Goal: 3 (05/09/20 1052)  Complications: No apparent anesthesia complications

## 2020-05-09 NOTE — Anesthesia Procedure Notes (Signed)
Procedure Name: Intubation Date/Time: 05/09/2020 1:43 PM Performed by: Barrington Ellison, CRNA Pre-anesthesia Checklist: Patient identified, Emergency Drugs available, Suction available and Patient being monitored Patient Re-evaluated:Patient Re-evaluated prior to induction Oxygen Delivery Method: Circle System Utilized Preoxygenation: Pre-oxygenation with 100% oxygen Induction Type: IV induction Ventilation: Mask ventilation without difficulty Laryngoscope Size: Mac and 3 Grade View: Grade I Tube type: Oral Tube size: 7.0 mm Number of attempts: 1 Airway Equipment and Method: Stylet and Oral airway Placement Confirmation: ETT inserted through vocal cords under direct vision,  positive ETCO2 and breath sounds checked- equal and bilateral Secured at: 21 cm Tube secured with: Tape Dental Injury: Teeth and Oropharynx as per pre-operative assessment

## 2020-05-09 NOTE — Op Note (Signed)
05/09/2020  3:11 PM  PATIENT:  Catherine Mullen  39 y.o. female presents with a recurrent disc herniation at L5/S1 on the right. She has opted for operative decompression  PRE-OPERATIVE DIAGNOSIS: recurrent Lumbar herniated disc right L5/S1  POST-OPERATIVE DIAGNOSIS: same  PROCEDURE:  Procedure(s): Right Lumbar Five Sacral One Microdiscectomy  SURGEON:   Surgeon(s): Coletta Memos, MD  ASSISTANTS:none  ANESTHESIA:   general  EBL:  Total I/O In: 1000 [I.V.:1000] Out: 10 [Blood:10]  BLOOD ADMINISTERED:none  CELL SAVER GIVEN:none  COUNT:per nursing  DRAINS: none   SPECIMEN:  No Specimen  DICTATION: [name] was taken to the operating room, intubated and placed under a general anesthetic without difficulty. She was positioned prone on a Wilson frame with all pressure points padded. Her back was prepped and draped in a sterile manner. I opened the skin with a 10 blade and carried the dissection down to the thoracolumbar fascia. I used both sharp dissection and the monopolar cautery to expose the lamina of L5, and S1. I confirmed my location with an intraoperative xray.  I used curettes and punches to separate the thecal sac from scar tissue. I was able to expose the bony margins. I used the punches to remove some of the scar tissue and exposed the thecal sac. I used blunt dissection to free the thecal sac from the disc space. Initially I worked in the axilla of the S1 root and gained entry in the disc space. The disc herniation was located superficially contained within the scar tissue overlying the disc space. I removed those fragments and emptied the disc space. No retained disc fragments were appreciated. I irrigated. I bathed the S1 root with depomedrol and fentanyl. I performed the procedure with microdissection.    I approximated the thoracolumbar fascia, subcutaneous, and subcuticular planes with vicryl sutures. I used dermabond for a sterile dressing.   PLAN OF CARE: Admit for  overnight observation  PATIENT DISPOSITION:  PACU - hemodynamically stable.   Delay start of Pharmacological VTE agent (>24hrs) due to surgical blood loss or risk of bleeding:  yes

## 2020-05-10 DIAGNOSIS — M5127 Other intervertebral disc displacement, lumbosacral region: Secondary | ICD-10-CM | POA: Diagnosis not present

## 2020-05-10 MED ORDER — TIZANIDINE HCL 4 MG PO TABS
4.0000 mg | ORAL_TABLET | Freq: Four times a day (QID) | ORAL | 0 refills | Status: AC | PRN
Start: 2020-05-10 — End: ?

## 2020-05-10 MED ORDER — OXYCODONE HCL 5 MG PO TABS: 5.0000 mg | ORAL_TABLET | Freq: Four times a day (QID) | ORAL | 0 refills | Status: AC | PRN

## 2020-05-10 NOTE — Discharge Summary (Signed)
Physician Discharge Summary  Patient ID: Catherine Mullen MRN: [id number] DOB/AGE: 1981-02-26 39 y.o.  Admit date: 05/09/2020 Discharge date: 05/10/2020  Admission Diagnoses:recurrent hnp right L5/S1  Discharge Diagnoses: same Active Problems:   HNP (herniated nucleus pulposus), lumbar   Discharged Condition: good  Hospital Course: Mrs. Catherine Mullen was admitted and taken to the operating room for an uncomplicated lumbar discetomy. Her wound is clean, dry, no signs of infection. Moving all extremities well. Tolerating a regular diet, and voiding.   Treatments: surgery: redo discetomy L5S1 on the right.   Discharge Exam: Blood pressure 110/81, pulse 80, temperature 98.3 F (36.8 C), temperature source Oral, resp. rate 18, height 5\' 1"  (1.549 m), weight 49.4 kg, last menstrual period 05/08/2020, SpO2 100 %. General appearance: alert, cooperative, appears stated age and mild distress Neurologic: Alert and oriented X 3, normal strength and tone. Normal symmetric reflexes. Normal coordination and gait  Disposition: Discharge disposition: 01-Home or Self Care      Lumbar herniated disc  Allergies as of 05/10/2020   No Known Allergies     Medication List    TAKE these medications   famotidine 20 MG tablet Commonly known as: PEPCID Take 1 tablet (20 mg total) by mouth 2 (two) times daily.   gabapentin 300 MG capsule Commonly known as: NEURONTIN Take 300 mg by mouth 3 (three) times daily.   ibuprofen 600 MG tablet Commonly known as: ADVIL Take 600 mg by mouth every 4 (four) hours as needed for headache or moderate pain.   oxyCODONE 5 MG immediate release tablet Commonly known as: Roxicodone Take 1 tablet (5 mg total) by mouth every 6 (six) hours as needed for up to 8 days for severe pain.   tiZANidine 4 MG tablet Commonly known as: ZANAFLEX Take 1 tablet (4 mg total) by mouth every 6 (six) hours as needed for muscle spasms.      Follow-up Information    05/12/2020, MD  Follow up in 3 week(s).   Specialty: Neurosurgery Why: please call to make an appointment Contact information: [address] [place] [phone]           Signed: [phone] 05/10/2020, 2:07 PM

## 2020-05-10 NOTE — Progress Notes (Signed)
Patient is discharged from room 3C03 at this time. Alert and in stable condition. IV site d/c'd and instructions read to patient with understanding verbalized and all questions answered. Left unit via wheelchair with all belongings at side. 

## 2020-05-10 NOTE — Discharge Instructions (Addendum)
Lumbar Discectomy Care After A discectomy involves removal of discmaterial (the cartilage-like structures located between the bones of the back). It is done to relieve pressure on nerve roots. It can be used as a treatment for a back problem. The time in surgery depends on the findings in surgery and what is necessary to correct the problems. HOME CARE INSTRUCTIONS   Check the cut (incision) made by the surgeon twice a day for signs of infection. Some signs of infection may include:   A foul smelling, greenish or yellowish discharge from the wound.   Increased pain.   Increased redness over the incision (operative) site.   The skin edges may separate.   Flu-like symptoms (problems).   A temperature above 101.5 F (38.6 C).   Change your bandages in about 24 to 36 hours following surgery or as directed.   You may shower tomrrow.  Avoid bathtubs, swimming pools and hot tubs for three weeks or until your incision has healed completely.  Follow your doctor's instructions as to safe activities, exercises, and physical therapy.   Weight reduction may be beneficial if you are overweight.   Daily exercise is helpful to prevent the return of problems. Walking is permitted. You may use a treadmill without an incline. Cut down on activities and exercise if you have discomfort. You may also go up and down stairs as much as you can tolerate.   DO NOT lift anything heavier than 10 to 15 lbs. Avoid bending or twisting at the waist. Always bend your knees when lifting.   Maintain strength and range of motion as instructed.   Do not drive for 10 days, or as directed by your doctors. You may be a passenger . Lying back in the passenger seat may be more comfortable for you. Always wear a seatbelt.   Limit your sitting in a regular chair to 20 to 30 minutes at a time. There are no limitations for sitting in a recliner. You should lie down or walk in between sitting periods.   Only take  over-the-counter or prescription medicines for pain, discomfort, or fever as directed by your caregiver.  SEEK MEDICAL CARE IF:   There is increased bleeding (more than a small spot) from the wound.   You notice redness, swelling, or increasing pain in the wound.   Pus is coming from wound.   You develop an unexplained oral temperature above 102 F (38.9 C) develops.   You notice a foul smell coming from the wound or dressing.   You have increasing pain in your wound.  SEEK IMMEDIATE MEDICAL CARE IF:   You develop a rash.   You have difficulty breathing.   You develop any allergic problems to medicines given.  Document Released: 11/19/2004 Document Revised: 12/04/2011 Document Reviewed: 03/09/2008 ExitCare Patient Information Will be discussed at you follow up appointment. Call Your Doctor If Any of These Occur Redness, drainage, or swelling at the wound.  Temperature greater than 101 degrees. Severe pain not relieved by pain medication. Incision starts to come apart. Follow Up Appt Call today for appointment in 2-4 weeks ([phone]) or for problems.

## 2020-06-21 ENCOUNTER — Other Ambulatory Visit (HOSPITAL_BASED_OUTPATIENT_CLINIC_OR_DEPARTMENT_OTHER): Payer: Self-pay | Admitting: Neurosurgery

## 2020-06-21 DIAGNOSIS — M5126 Other intervertebral disc displacement, lumbar region: Secondary | ICD-10-CM

## 2020-07-07 ENCOUNTER — Ambulatory Visit (HOSPITAL_BASED_OUTPATIENT_CLINIC_OR_DEPARTMENT_OTHER)
Admission: RE | Admit: 2020-07-07 | Discharge: 2020-07-07 | Disposition: A | Discharge status: Home / Self Care | Payer: Medicaid Other | Source: Ambulatory Visit | Attending: Neurosurgery | Admitting: Neurosurgery

## 2020-07-07 ENCOUNTER — Other Ambulatory Visit: Payer: Self-pay

## 2020-07-07 DIAGNOSIS — M5126 Other intervertebral disc displacement, lumbar region: Secondary | ICD-10-CM | POA: Insufficient documentation

## 2020-07-07 IMAGING — MR MR LUMBAR SPINE WO/W CM
4 of 7 series · 23 of 48 positions shown · IV contrast (GADAVIST)
Comparison: MRI [DATE]

CLINICAL DATA: Low back pain over the last 3-4 weeks. Right leg
numbness radiating to the foot.

EXAM:
MRI LUMBAR SPINE WITHOUT AND WITH CONTRAST
TECHNIQUE: Multiplanar and multiecho pulse sequences of the lumbar spine were
obtained without and with intravenous contrast.
CONTRAST:  5mL GADAVIST GADOBUTROL 1 MMOL/ML IV SOLN

[Series 2: T1 · sagittal · 4.0mm · 0.81mm/px · 3 of 14 slices shown (1 of 2)]
[im 1/14]
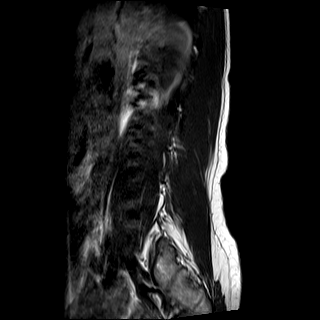
[im 7/14]
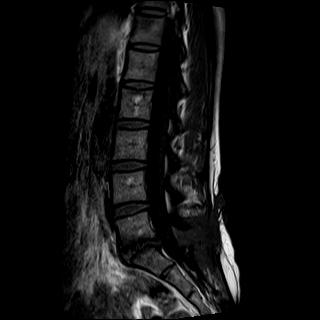
[im 14/14]
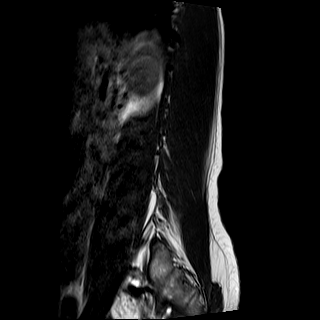

[Series 4: T2 · axial · 4.0mm · 0.39mm/px · z∈[-163,+11]mm · 11 of 36 slices shown (1 of 2)]
[im 1/36]
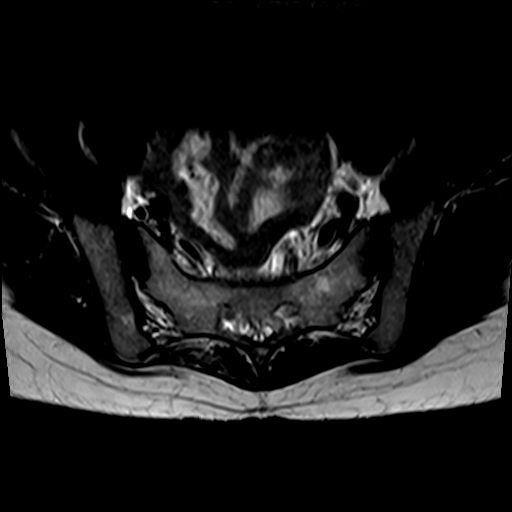
[im 4/36]
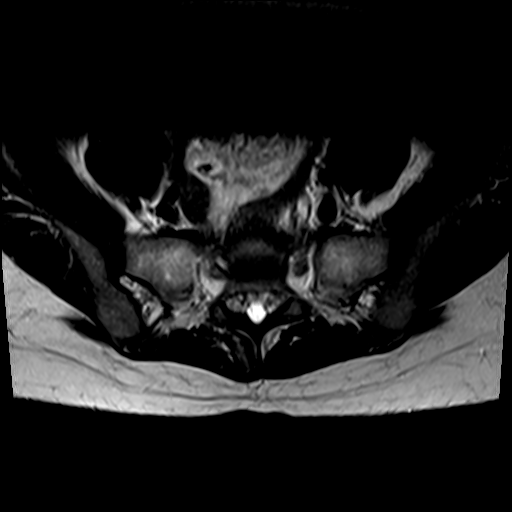
[im 8/36]
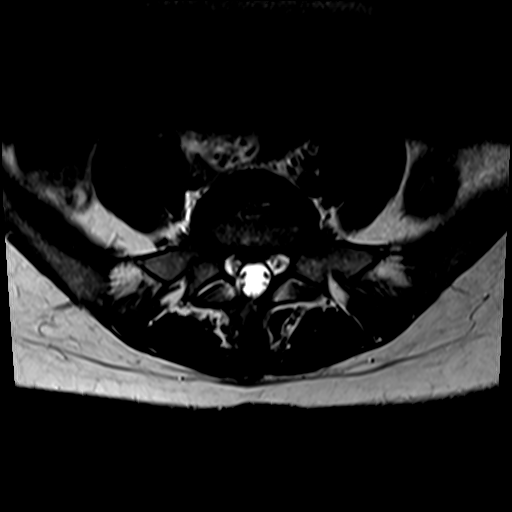
[im 11/36]
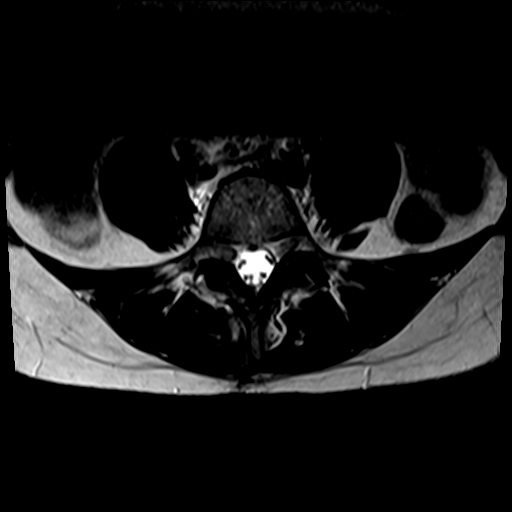
[im 15/36]
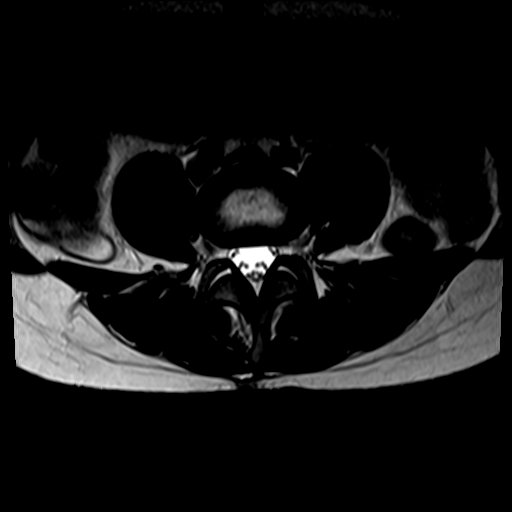
[im 18/36]
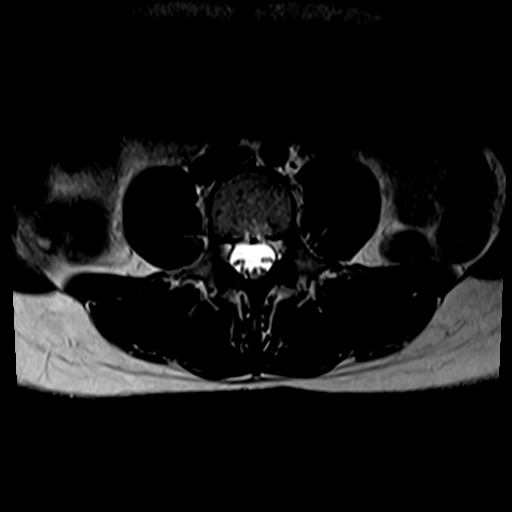
[im 22/36]
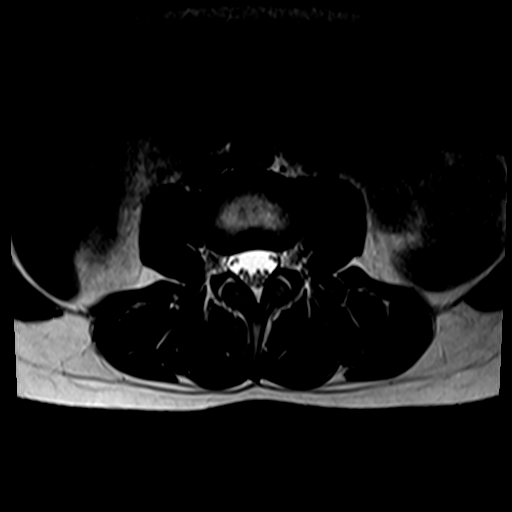
[im 25/36]
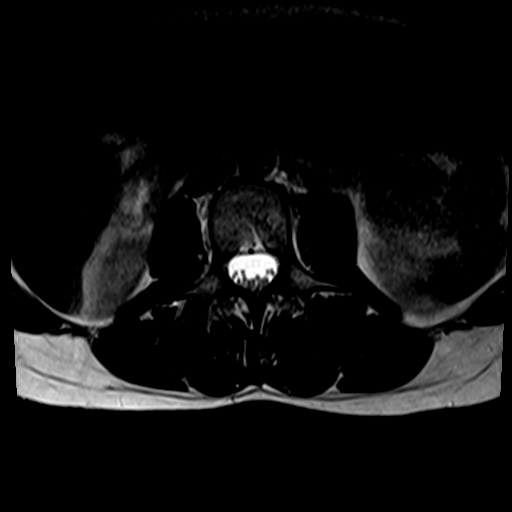
[im 29/36]
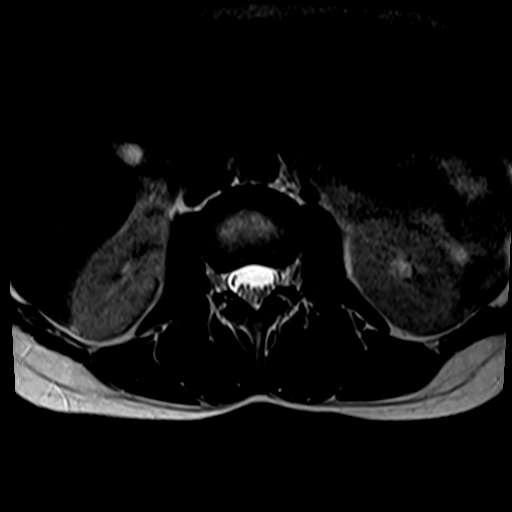
[im 32/36]
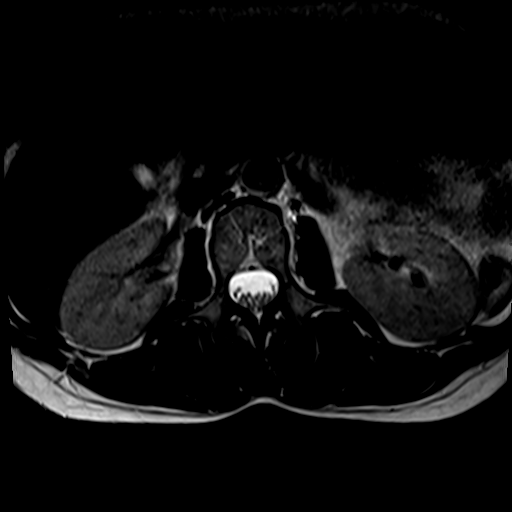
[im 36/36]
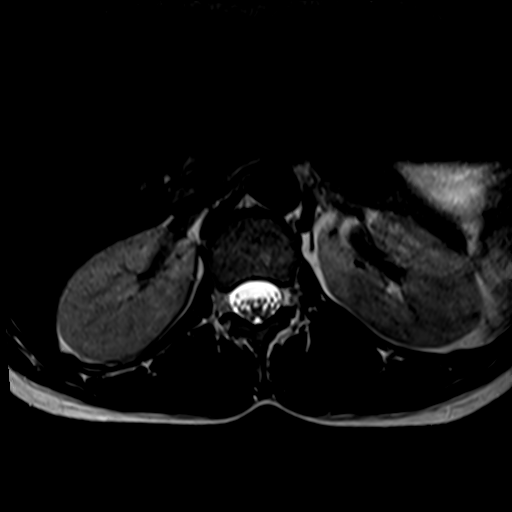

[Series 5: T1 · axial · 4.0mm · 0.39mm/px · z∈[-163,-9]mm · 5 of 36 slices shown (2 of 2)]
[im 1/36]
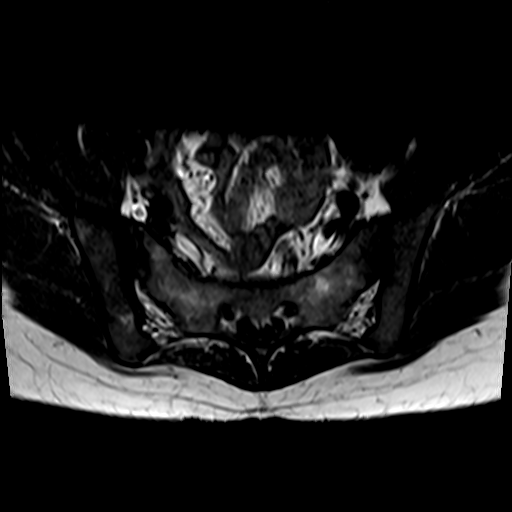
[im 4/36]
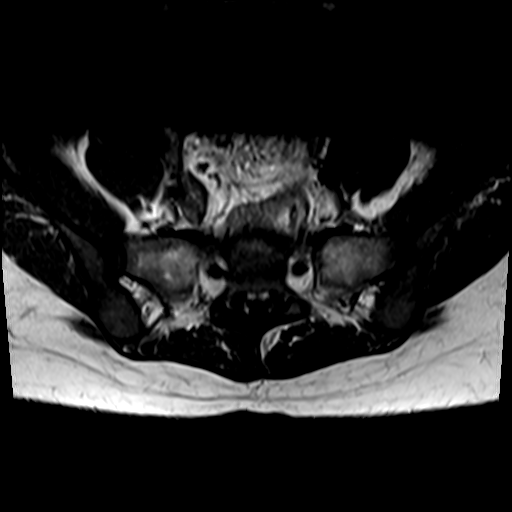
[im 8/36]
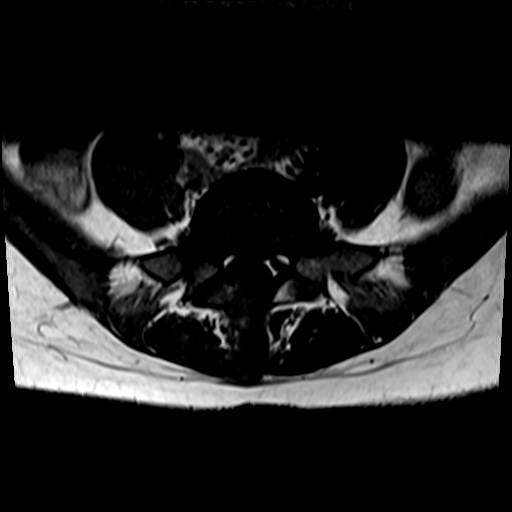
[im 18/36]
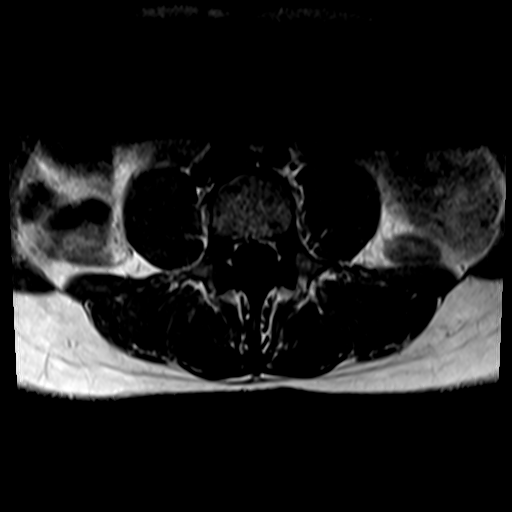
[im 32/36]
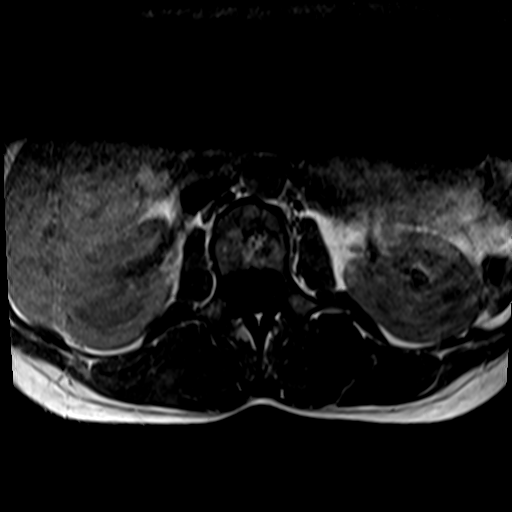

[Series 6: T2 · sagittal · 4.0mm · 0.81mm/px · 4 of 14 slices shown (2 of 2)]
[im 1/14]
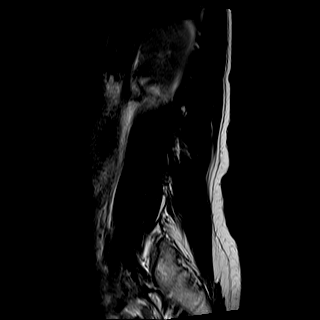
[im 5/14]
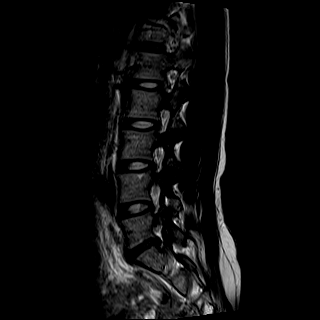
[im 9/14]
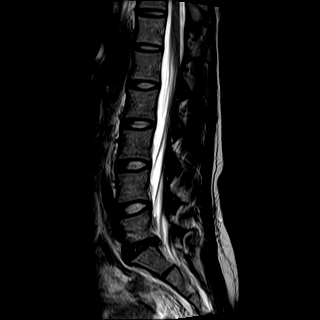
[im 14/14]
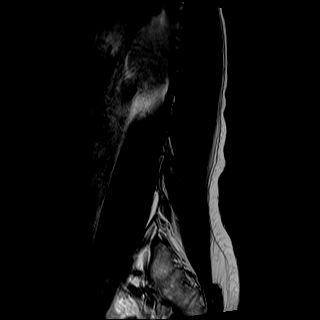

[23 of 48 positions shown; findings below may reference images not displayed]

FINDINGS: Segmentation:  5 lumbar type vertebral bodies.

Alignment:  Normal

Vertebrae: Discogenic endplate marrow changes at L5-S1 with mild
edema and enhancement.

Conus medullaris and cauda equina: Conus extends to the L1-2 level.
Conus and cauda equina appear normal.

Paraspinal and other soft tissues: Negative

Disc levels:

No abnormality at L4-5 or above.

L5-S1: Previous right hemilaminectomy and discectomy. Endplate
osteophytes and mild bulging of the disc. Mild epidural fibrosis on
the right. No evidence of residual or recurrent disc herniation.
IMPRESSION: Satisfactory appearance at the L5-S1 level. No evidence of residual
or recurrent disc herniation today. Discogenic endplate marrow
changes with mild edema as seen previously.

## 2020-07-07 MED ORDER — GADOBUTROL 1 MMOL/ML IV SOLN
5.0000 mL | Freq: Once | INTRAVENOUS | Status: AC | PRN
  Administered 2020-07-07: 5 mL via INTRAVENOUS

## 2020-12-29 DIAGNOSIS — Z419 Encounter for procedure for purposes other than remedying health state, unspecified: Secondary | ICD-10-CM | POA: Diagnosis not present

## 2021-01-29 DIAGNOSIS — Z419 Encounter for procedure for purposes other than remedying health state, unspecified: Secondary | ICD-10-CM | POA: Diagnosis not present

## 2021-02-26 DIAGNOSIS — Z419 Encounter for procedure for purposes other than remedying health state, unspecified: Secondary | ICD-10-CM | POA: Diagnosis not present

## 2021-03-29 DIAGNOSIS — Z419 Encounter for procedure for purposes other than remedying health state, unspecified: Secondary | ICD-10-CM | POA: Diagnosis not present

## 2021-04-11 DIAGNOSIS — M545 Low back pain, unspecified: Secondary | ICD-10-CM | POA: Diagnosis not present

## 2021-04-25 DIAGNOSIS — N898 Other specified noninflammatory disorders of vagina: Secondary | ICD-10-CM | POA: Diagnosis not present

## 2021-04-28 DIAGNOSIS — Z419 Encounter for procedure for purposes other than remedying health state, unspecified: Secondary | ICD-10-CM | POA: Diagnosis not present

## 2021-05-29 DIAGNOSIS — Z419 Encounter for procedure for purposes other than remedying health state, unspecified: Secondary | ICD-10-CM | POA: Diagnosis not present

## 2021-06-28 DIAGNOSIS — Z419 Encounter for procedure for purposes other than remedying health state, unspecified: Secondary | ICD-10-CM | POA: Diagnosis not present

## 2021-07-18 DIAGNOSIS — M5126 Other intervertebral disc displacement, lumbar region: Secondary | ICD-10-CM | POA: Diagnosis not present

## 2021-07-18 DIAGNOSIS — R030 Elevated blood-pressure reading, without diagnosis of hypertension: Secondary | ICD-10-CM | POA: Diagnosis not present

## 2021-07-22 ENCOUNTER — Other Ambulatory Visit (HOSPITAL_BASED_OUTPATIENT_CLINIC_OR_DEPARTMENT_OTHER): Payer: Self-pay | Admitting: Neurosurgery

## 2021-07-22 DIAGNOSIS — M5126 Other intervertebral disc displacement, lumbar region: Secondary | ICD-10-CM

## 2021-07-29 DIAGNOSIS — Z419 Encounter for procedure for purposes other than remedying health state, unspecified: Secondary | ICD-10-CM | POA: Diagnosis not present

## 2021-08-03 ENCOUNTER — Other Ambulatory Visit: Payer: Self-pay

## 2021-08-03 ENCOUNTER — Ambulatory Visit (HOSPITAL_BASED_OUTPATIENT_CLINIC_OR_DEPARTMENT_OTHER)
Admission: RE | Admit: 2021-08-03 | Discharge: 2021-08-03 | Disposition: A | Discharge status: Home / Self Care | Payer: Medicaid Other | Source: Ambulatory Visit | Attending: Neurosurgery | Admitting: Neurosurgery

## 2021-08-03 DIAGNOSIS — M5126 Other intervertebral disc displacement, lumbar region: Secondary | ICD-10-CM | POA: Diagnosis not present

## 2021-08-03 DIAGNOSIS — M545 Low back pain, unspecified: Secondary | ICD-10-CM | POA: Diagnosis not present

## 2021-08-03 IMAGING — MR MR LUMBAR SPINE WO/W CM
4 of 8 series · 25 of 48 positions shown · IV contrast (gadavist)
Comparison: [DATE]

CLINICAL DATA: Herniated lumbar intervertebral disc. Chronic low
back and right leg pain. Numbness in left foot. Prior surgery.

EXAM:
MRI LUMBAR SPINE WITHOUT AND WITH CONTRAST
TECHNIQUE: Multiplanar and multiecho pulse sequences of the lumbar spine were
obtained without and with intravenous contrast.
CONTRAST:  5mL GADAVIST GADOBUTROL 1 MMOL/ML IV SOLN

[Series 3: T1 · sagittal · 4.0mm · 0.51mm/px · 4 of 13 slices shown (1 of 2)]
[im 1/13]
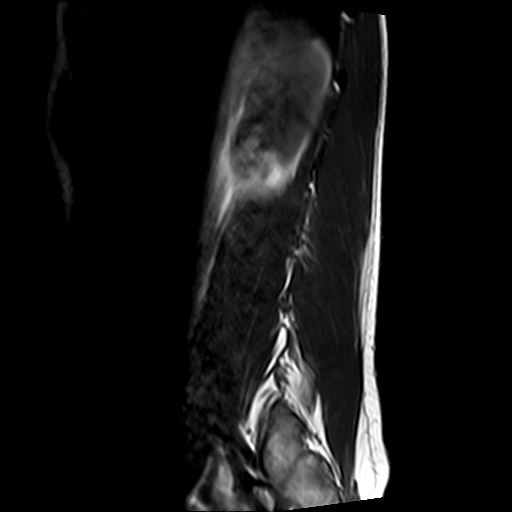
[im 5/13]
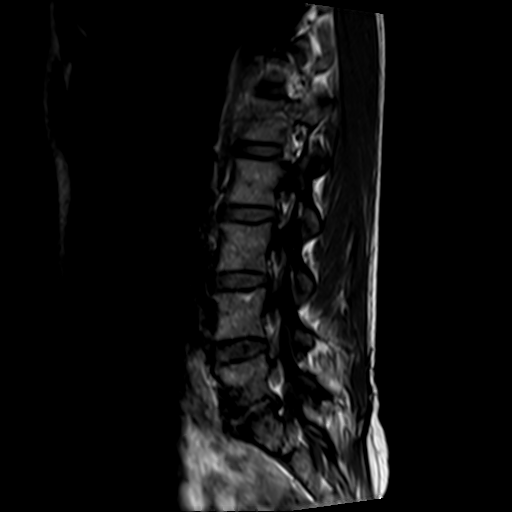
[im 9/13]
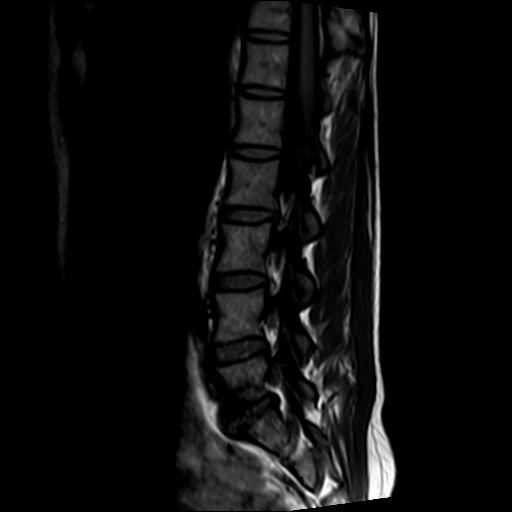
[im 13/13]
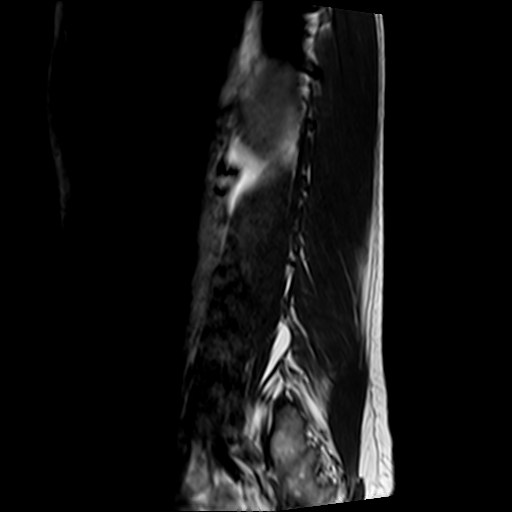

[Series 4: T2 · axial · 4.0mm · 0.78mm/px · z∈[-120,+67]mm · 9 of 34 slices shown (1 of 2)]
[im 1/34]
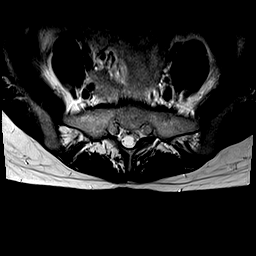
[im 5/34]
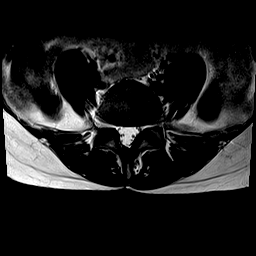
[im 9/34]
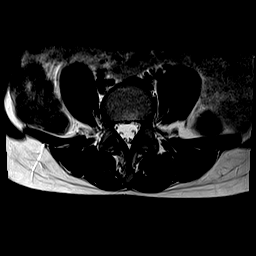
[im 13/34]
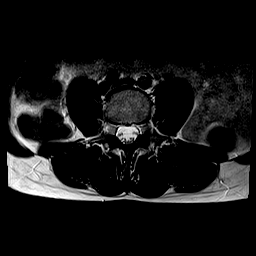
[im 17/34]
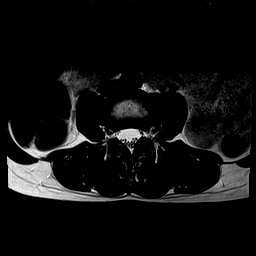
[im 21/34]
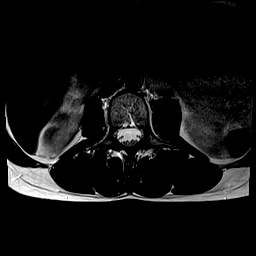
[im 25/34]
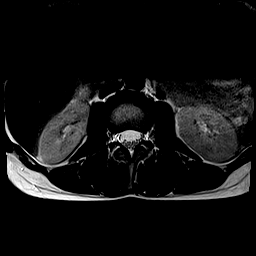
[im 29/34]
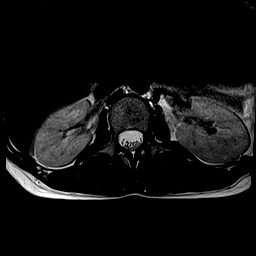
[im 34/34]
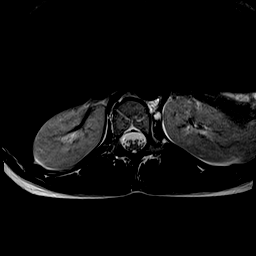

[Series 5: T1 · axial · 4.0mm · 0.39mm/px · z∈[-120,+42]mm · 8 of 34 slices shown (2 of 2)]
[im 1/34]
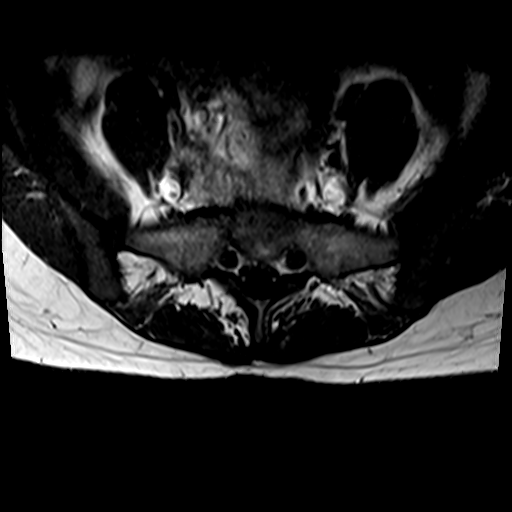
[im 5/34]
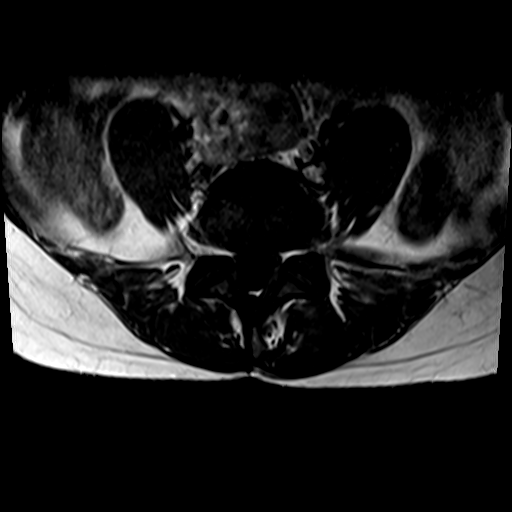
[im 9/34]
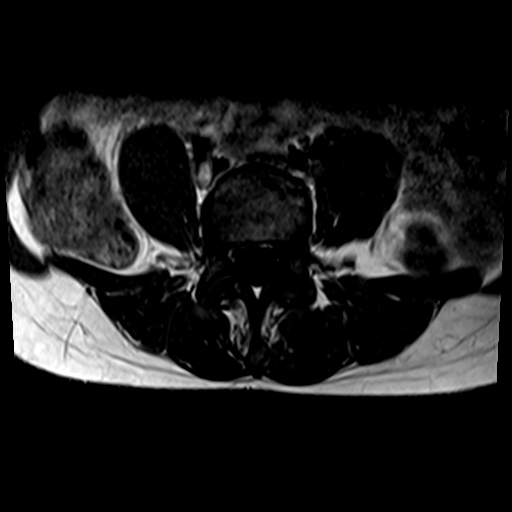
[im 13/34]
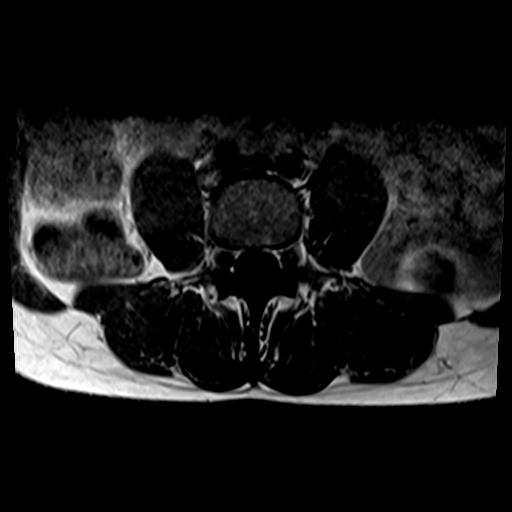
[im 17/34]
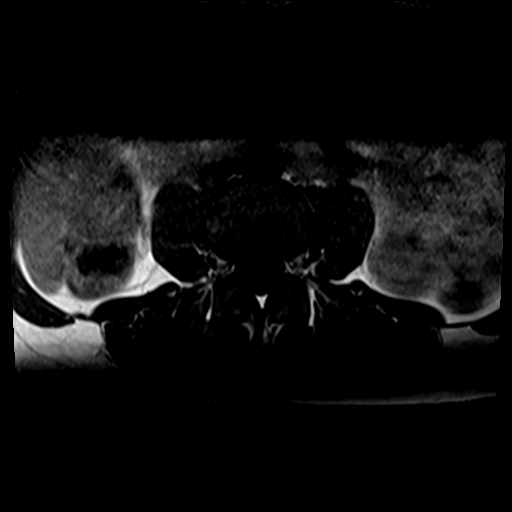
[im 21/34]
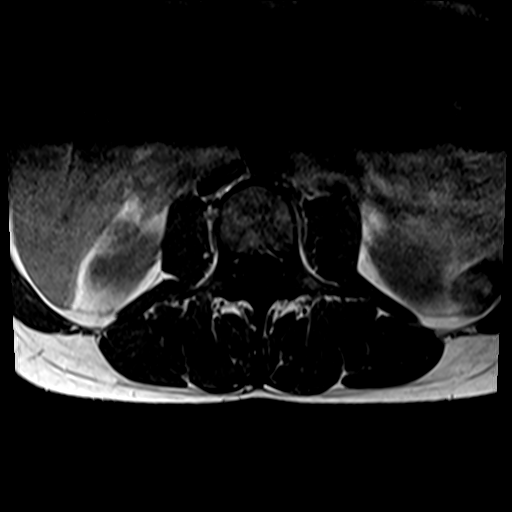
[im 25/34]
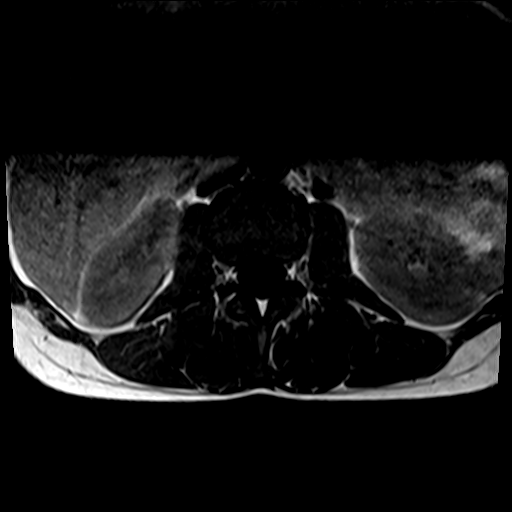
[im 29/34]
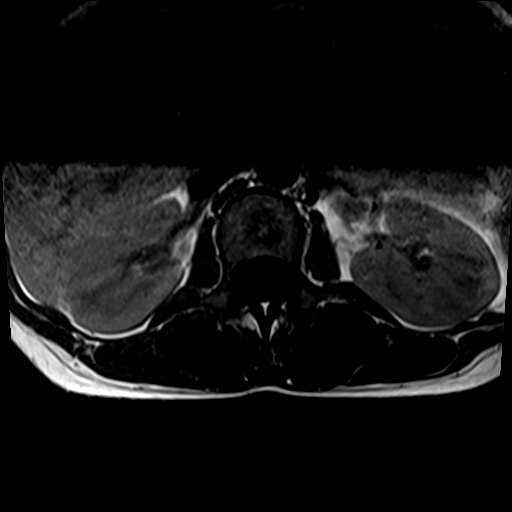

[Series 6: T2 · sagittal · 4.0mm · 0.51mm/px · 4 of 13 slices shown (2 of 2)]
[im 1/13]
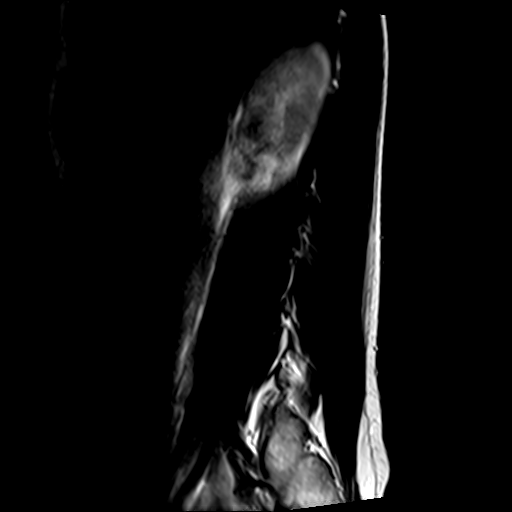
[im 5/13]
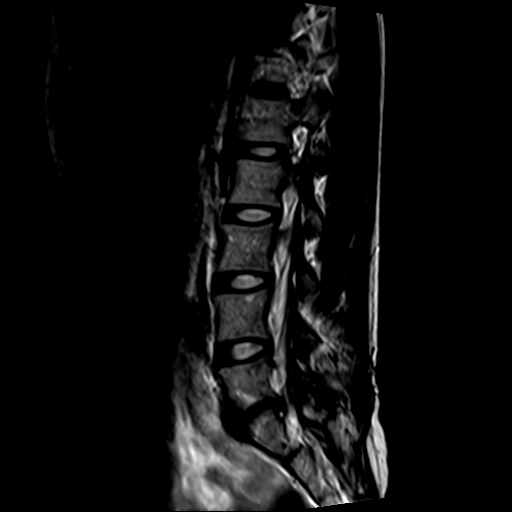
[im 9/13]
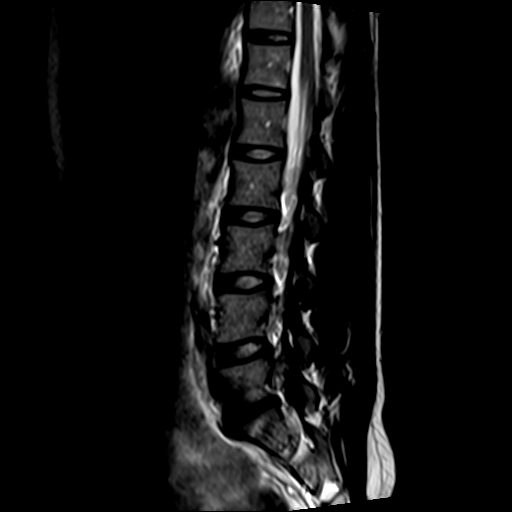
[im 13/13]
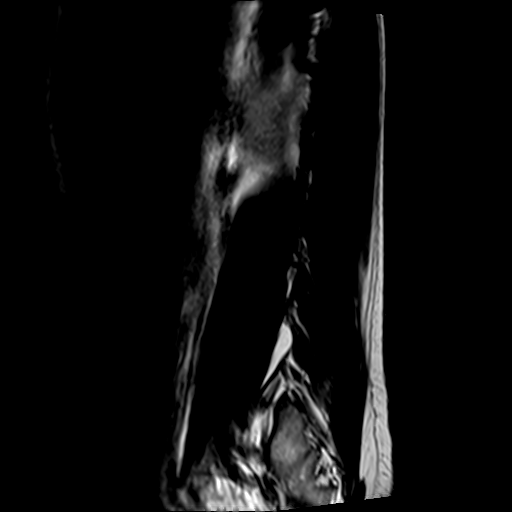

[25 of 48 positions shown; findings below may reference images not displayed]

FINDINGS: Segmentation:  Standard.

Alignment:  Unchanged minimal retrolisthesis of L5 on S1.

Vertebrae: No fracture or suspicious marrow lesion. Progressive
degenerative endplate changes at L5-S1 with persistent mild edema
and enhancement.

Conus medullaris and cauda equina: Conus extends to the L1-2 level.
Conus and cauda equina appear normal.

Paraspinal and other soft tissues: Unremarkable.

Disc levels:

L1-2 through L4-5: Negative.

L5-S1: Disc desiccation and progressive severe disc space narrowing.
Prior right laminectomy with mild epidural fibrosis. Disc bulging,
endplate spurring, and mild facet hypertrophy result in unchanged
mild left neural foraminal stenosis. No spinal stenosis or right
neural foraminal stenosis.
IMPRESSION: 1. Progressive disc degeneration at L5-S1 with unchanged mild left
neural foraminal stenosis. No spinal stenosis or recurrent disc
herniation.
2. Normal discs elsewhere.

## 2021-08-03 MED ORDER — GADOBUTROL 1 MMOL/ML IV SOLN
5.0000 mL | Freq: Once | INTRAVENOUS | Status: AC | PRN
  Administered 2021-08-03: 5 mL via INTRAVENOUS

## 2021-08-08 DIAGNOSIS — M5126 Other intervertebral disc displacement, lumbar region: Secondary | ICD-10-CM | POA: Diagnosis not present

## 2021-08-14 DIAGNOSIS — M79604 Pain in right leg: Secondary | ICD-10-CM | POA: Diagnosis not present

## 2021-08-15 DIAGNOSIS — M79604 Pain in right leg: Secondary | ICD-10-CM | POA: Diagnosis not present

## 2021-08-15 DIAGNOSIS — M7061 Trochanteric bursitis, right hip: Secondary | ICD-10-CM | POA: Diagnosis not present

## 2021-08-29 DIAGNOSIS — Z419 Encounter for procedure for purposes other than remedying health state, unspecified: Secondary | ICD-10-CM | POA: Diagnosis not present

## 2021-09-28 DIAGNOSIS — Z419 Encounter for procedure for purposes other than remedying health state, unspecified: Secondary | ICD-10-CM | POA: Diagnosis not present

## 2021-10-29 DIAGNOSIS — Z419 Encounter for procedure for purposes other than remedying health state, unspecified: Secondary | ICD-10-CM | POA: Diagnosis not present

## 2021-11-28 DIAGNOSIS — Z419 Encounter for procedure for purposes other than remedying health state, unspecified: Secondary | ICD-10-CM | POA: Diagnosis not present

## 2021-12-21 DIAGNOSIS — M47817 Spondylosis without myelopathy or radiculopathy, lumbosacral region: Secondary | ICD-10-CM | POA: Diagnosis not present

## 2021-12-21 DIAGNOSIS — I878 Other specified disorders of veins: Secondary | ICD-10-CM | POA: Diagnosis not present

## 2021-12-21 DIAGNOSIS — I700 Atherosclerosis of aorta: Secondary | ICD-10-CM | POA: Diagnosis not present

## 2021-12-21 DIAGNOSIS — U071 COVID-19: Secondary | ICD-10-CM | POA: Diagnosis not present

## 2021-12-21 DIAGNOSIS — D508 Other iron deficiency anemias: Secondary | ICD-10-CM | POA: Diagnosis not present

## 2021-12-21 DIAGNOSIS — Z5321 Procedure and treatment not carried out due to patient leaving prior to being seen by health care provider: Secondary | ICD-10-CM | POA: Diagnosis not present

## 2021-12-21 DIAGNOSIS — K5790 Diverticulosis of intestine, part unspecified, without perforation or abscess without bleeding: Secondary | ICD-10-CM | POA: Diagnosis not present

## 2021-12-29 DIAGNOSIS — Z419 Encounter for procedure for purposes other than remedying health state, unspecified: Secondary | ICD-10-CM | POA: Diagnosis not present

## 2022-01-29 DIAGNOSIS — Z419 Encounter for procedure for purposes other than remedying health state, unspecified: Secondary | ICD-10-CM | POA: Diagnosis not present

## 2022-02-26 DIAGNOSIS — Z419 Encounter for procedure for purposes other than remedying health state, unspecified: Secondary | ICD-10-CM | POA: Diagnosis not present

## 2022-03-29 DIAGNOSIS — Z419 Encounter for procedure for purposes other than remedying health state, unspecified: Secondary | ICD-10-CM | POA: Diagnosis not present

## 2022-04-09 DIAGNOSIS — R87613 High grade squamous intraepithelial lesion on cytologic smear of cervix (HGSIL): Secondary | ICD-10-CM | POA: Diagnosis not present

## 2022-04-09 DIAGNOSIS — R87810 Cervical high risk human papillomavirus (HPV) DNA test positive: Secondary | ICD-10-CM | POA: Diagnosis not present

## 2022-04-09 DIAGNOSIS — N898 Other specified noninflammatory disorders of vagina: Secondary | ICD-10-CM | POA: Diagnosis not present

## 2022-04-09 DIAGNOSIS — R87610 Atypical squamous cells of undetermined significance on cytologic smear of cervix (ASC-US): Secondary | ICD-10-CM | POA: Diagnosis not present

## 2022-04-25 DIAGNOSIS — Z1152 Encounter for screening for COVID-19: Secondary | ICD-10-CM | POA: Diagnosis not present

## 2022-04-28 DIAGNOSIS — Z419 Encounter for procedure for purposes other than remedying health state, unspecified: Secondary | ICD-10-CM | POA: Diagnosis not present

## 2022-05-11 DIAGNOSIS — Z1152 Encounter for screening for COVID-19: Secondary | ICD-10-CM | POA: Diagnosis not present

## 2022-05-17 DIAGNOSIS — Z1152 Encounter for screening for COVID-19: Secondary | ICD-10-CM | POA: Diagnosis not present

## 2022-05-23 DIAGNOSIS — Z1152 Encounter for screening for COVID-19: Secondary | ICD-10-CM | POA: Diagnosis not present

## 2022-05-27 DIAGNOSIS — M5126 Other intervertebral disc displacement, lumbar region: Secondary | ICD-10-CM | POA: Diagnosis not present

## 2022-05-29 DIAGNOSIS — Z419 Encounter for procedure for purposes other than remedying health state, unspecified: Secondary | ICD-10-CM | POA: Diagnosis not present

## 2022-05-30 ENCOUNTER — Other Ambulatory Visit (HOSPITAL_BASED_OUTPATIENT_CLINIC_OR_DEPARTMENT_OTHER): Payer: Self-pay | Admitting: Neurosurgery

## 2022-05-30 DIAGNOSIS — M5126 Other intervertebral disc displacement, lumbar region: Secondary | ICD-10-CM

## 2022-06-07 DIAGNOSIS — H1032 Unspecified acute conjunctivitis, left eye: Secondary | ICD-10-CM | POA: Diagnosis not present

## 2022-06-11 ENCOUNTER — Ambulatory Visit (HOSPITAL_BASED_OUTPATIENT_CLINIC_OR_DEPARTMENT_OTHER)
Admission: RE | Admit: 2022-06-11 | Discharge: 2022-06-11 | Disposition: A | Discharge status: Home / Self Care | Payer: Medicaid Other | Source: Ambulatory Visit | Attending: Neurosurgery | Admitting: Neurosurgery

## 2022-06-11 DIAGNOSIS — M5137 Other intervertebral disc degeneration, lumbosacral region: Secondary | ICD-10-CM | POA: Diagnosis not present

## 2022-06-11 DIAGNOSIS — M48061 Spinal stenosis, lumbar region without neurogenic claudication: Secondary | ICD-10-CM | POA: Diagnosis not present

## 2022-06-11 DIAGNOSIS — M5126 Other intervertebral disc displacement, lumbar region: Secondary | ICD-10-CM | POA: Insufficient documentation

## 2022-06-11 IMAGING — MR MR LUMBAR SPINE WO/W CM
4 of 7 series · 27 of 48 positions shown · IV contrast (GADAVIST)
Comparison: Prior MRI from [DATE].

CLINICAL DATA: Initial evaluation for low back pain with radiation
into the right hip and leg with weakness for several months. History
of prior surgery.

EXAM:
MRI LUMBAR SPINE WITHOUT AND WITH CONTRAST
TECHNIQUE: Multiplanar and multiecho pulse sequences of the lumbar spine were
obtained without and with intravenous contrast.
CONTRAST:  5mL GADAVIST GADOBUTROL 1 MMOL/ML IV SOLN

[Series 2: T1 · sagittal · 4.0mm · 0.41mm/px · 3 of 15 slices shown (1 of 2)]
[im 1/15]
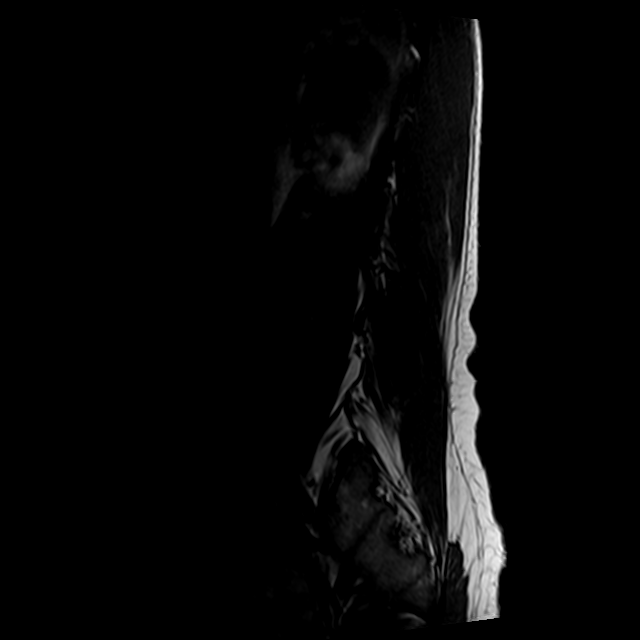
[im 8/15]
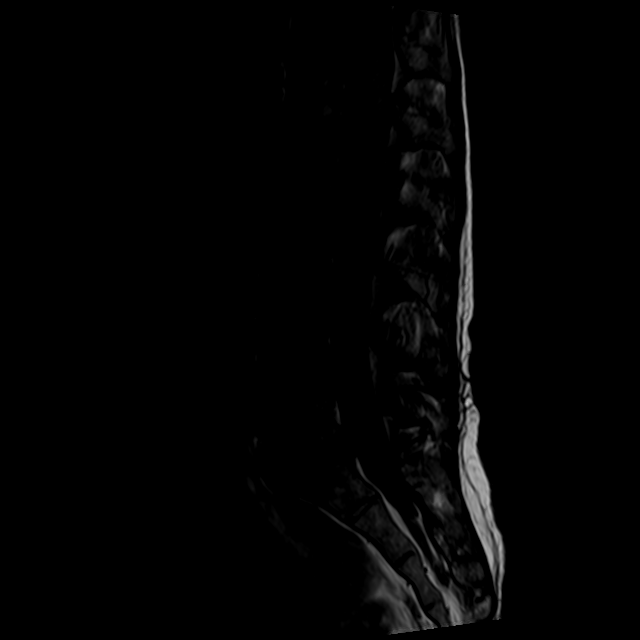
[im 15/15]
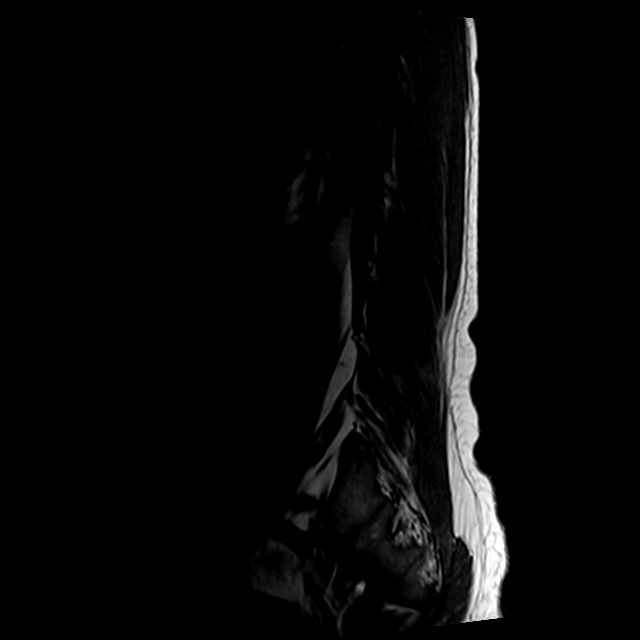

[Series 4: T2 · axial · 4.0mm · 0.78mm/px · z∈[-102,+118]mm · 11 of 40 slices shown (1 of 2)]
[im 1/40]
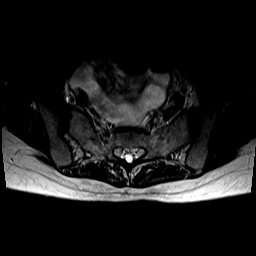
[im 4/40]
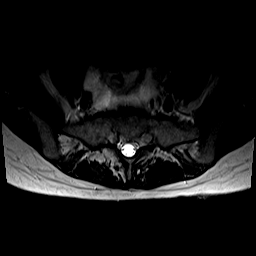
[im 8/40]
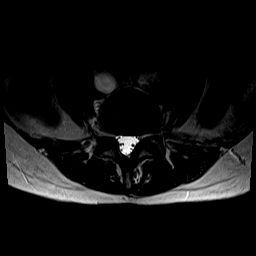
[im 12/40]
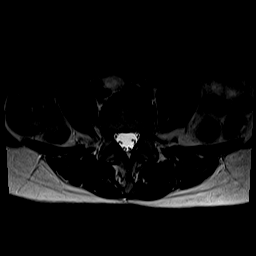
[im 16/40]
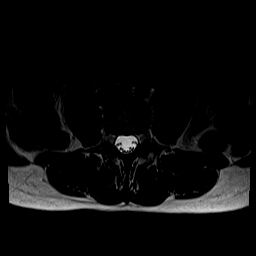
[im 20/40]
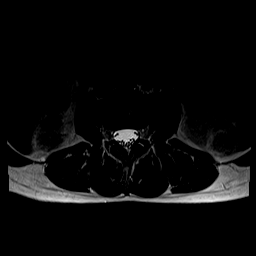
[im 24/40]
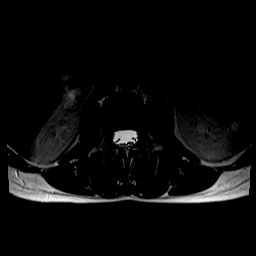
[im 28/40]
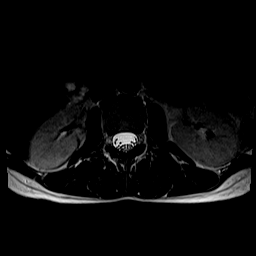
[im 32/40]
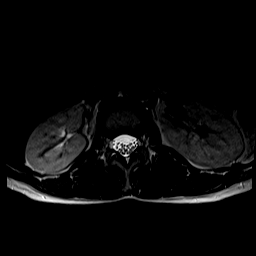
[im 36/40]
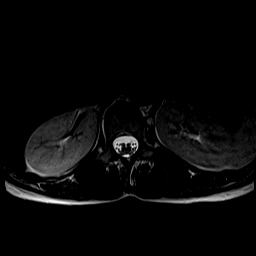
[im 40/40]
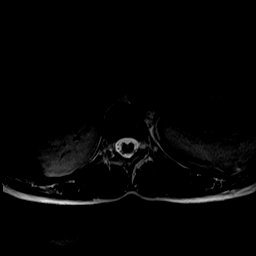

[Series 5: T1 · axial · 4.0mm · 0.39mm/px · z∈[-102,+98]mm · 9 of 40 slices shown (2 of 2)]
[im 1/40]
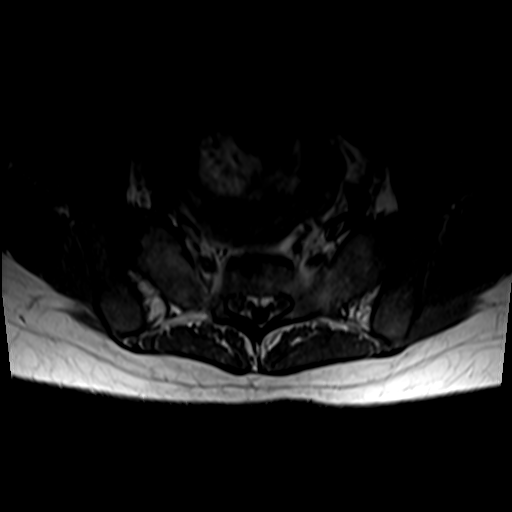
[im 4/40]
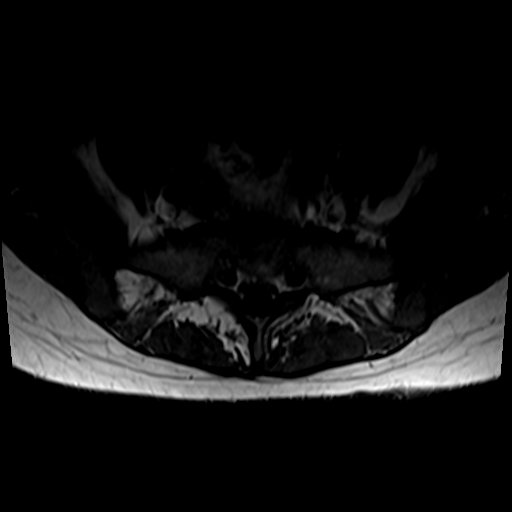
[im 8/40]
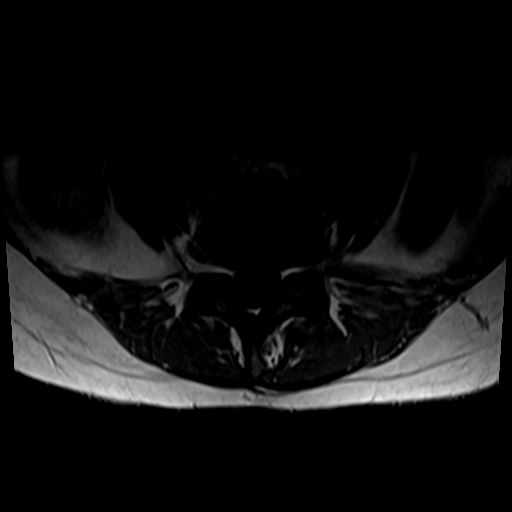
[im 12/40]
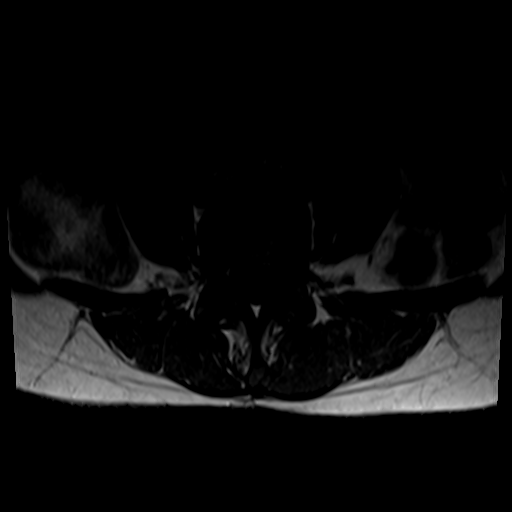
[im 16/40]
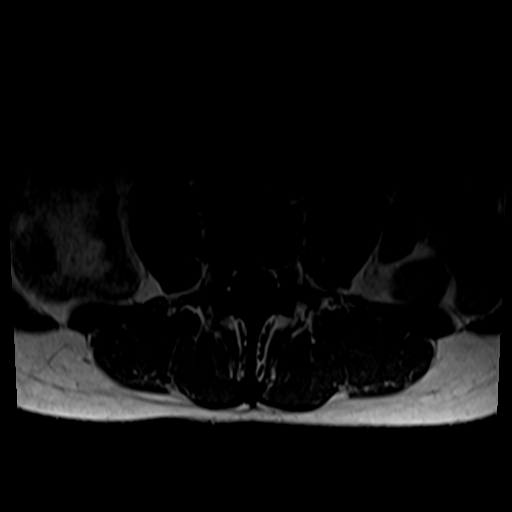
[im 20/40]
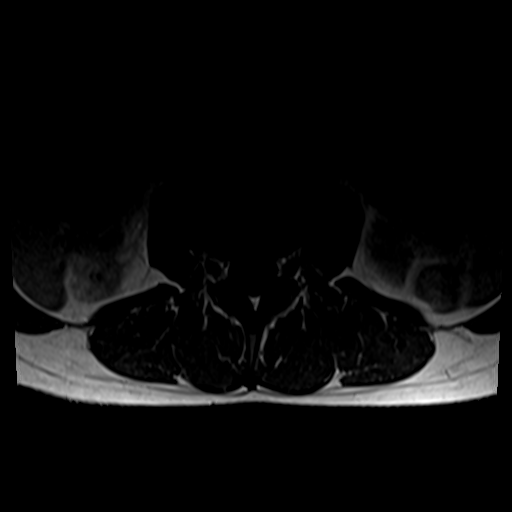
[im 24/40]
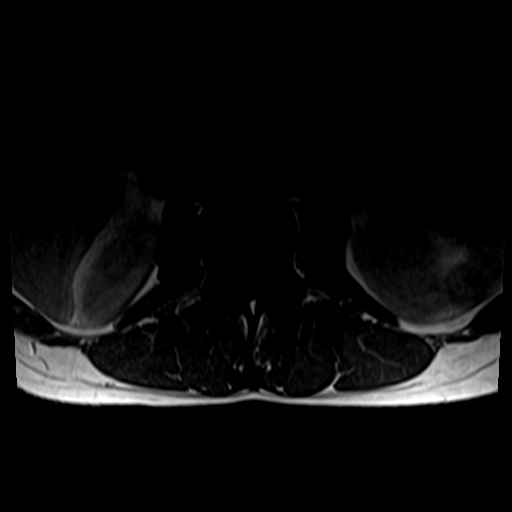
[im 28/40]
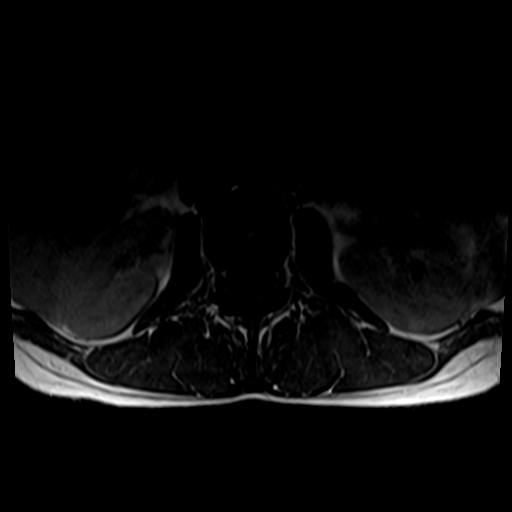
[im 36/40]
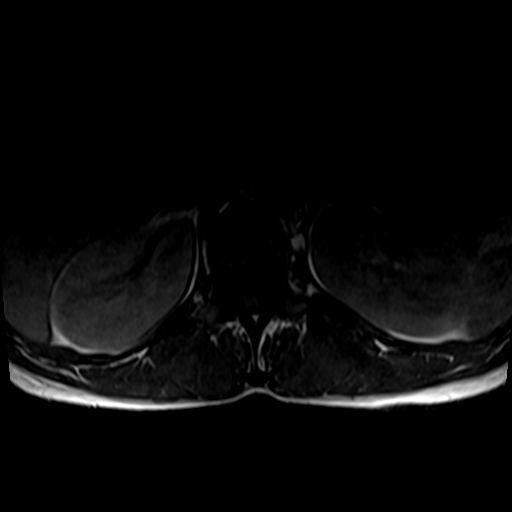

[Series 6: T2 · sagittal · 4.0mm · 0.81mm/px · 4 of 15 slices shown (2 of 2)]
[im 1/15]
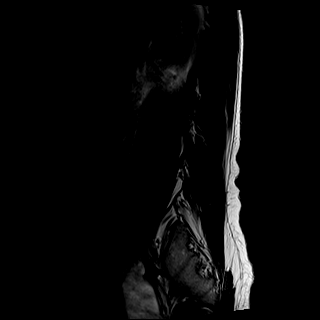
[im 5/15]
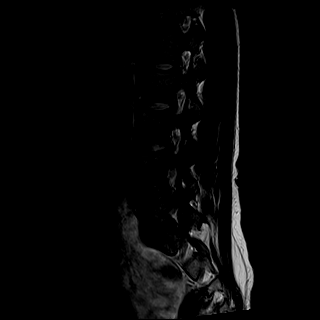
[im 10/15]
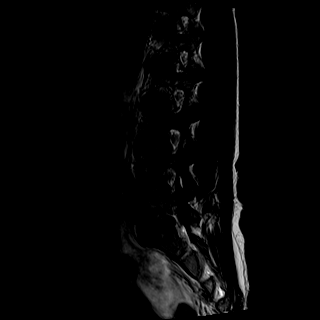
[im 15/15]
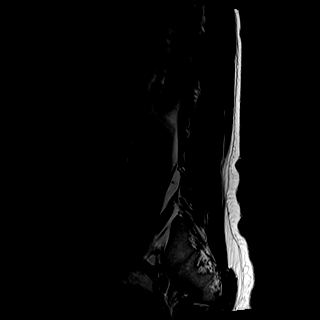

[27 of 48 positions shown; findings below may reference images not displayed]

FINDINGS: Segmentation: Standard. Lowest well-formed disc space labeled the
L5-S1 level.

Alignment: Mild dextroscoliosis with trace retrolisthesis of L5 on
S1, stable.

Vertebrae: Vertebral body height maintained without acute or chronic
fracture. Bone marrow signal intensity within normal limits. No
discrete or worrisome osseous lesions. Prominent discogenic reactive
endplate change present about the L5-S1 interspace with mild marrow
edema, progressed from prior. No other abnormal marrow edema.

Conus medullaris and cauda equina: Conus extends to the L1 level.
Conus and cauda equina appear normal.

Paraspinal and other soft tissues: Unremarkable.

Disc levels:

No significant findings are seen from the L4-5 level and above.

L5-S1: Degenerative intervertebral disc space narrowing with disc
desiccation and diffuse disc bulge. Prominent reactive endplate
change, mildly progressed from prior. Postoperative changes from
prior right hemi laminectomy. Mild postoperative granulation tissue
partially surrounds the descending right S1 nerve root. Mild facet
hypertrophy. No new or recurrent disc herniation. Stable mild left
L5 foraminal stenosis. Right neural foramen remains patent.
IMPRESSION: 1. Progressive degenerative disc disease at L5-S1 with similar mild
left L5 foraminal stenosis. No recurrent disc herniation or spinal
stenosis.
2. Otherwise stable and normal MRI of the lumbar spine.

## 2022-06-11 MED ORDER — GADOBUTROL 1 MMOL/ML IV SOLN
5.0000 mL | Freq: Once | INTRAVENOUS | Status: AC | PRN
Start: 2022-06-11 — End: 2022-06-11
  Administered 2022-06-11: 5 mL via INTRAVENOUS

## 2022-06-28 DIAGNOSIS — Z419 Encounter for procedure for purposes other than remedying health state, unspecified: Secondary | ICD-10-CM | POA: Diagnosis not present

## 2022-07-29 DIAGNOSIS — Z419 Encounter for procedure for purposes other than remedying health state, unspecified: Secondary | ICD-10-CM | POA: Diagnosis not present

## 2022-08-29 DIAGNOSIS — Z419 Encounter for procedure for purposes other than remedying health state, unspecified: Secondary | ICD-10-CM | POA: Diagnosis not present

## 2022-09-28 DIAGNOSIS — Z419 Encounter for procedure for purposes other than remedying health state, unspecified: Secondary | ICD-10-CM | POA: Diagnosis not present

## 2022-10-29 DIAGNOSIS — Z419 Encounter for procedure for purposes other than remedying health state, unspecified: Secondary | ICD-10-CM | POA: Diagnosis not present

## 2022-11-28 DIAGNOSIS — Z419 Encounter for procedure for purposes other than remedying health state, unspecified: Secondary | ICD-10-CM | POA: Diagnosis not present

## 2022-12-29 DIAGNOSIS — Z419 Encounter for procedure for purposes other than remedying health state, unspecified: Secondary | ICD-10-CM | POA: Diagnosis not present

## 2023-01-29 DIAGNOSIS — Z419 Encounter for procedure for purposes other than remedying health state, unspecified: Secondary | ICD-10-CM | POA: Diagnosis not present

## 2023-02-27 DIAGNOSIS — Z419 Encounter for procedure for purposes other than remedying health state, unspecified: Secondary | ICD-10-CM | POA: Diagnosis not present

## 2023-03-30 DIAGNOSIS — Z419 Encounter for procedure for purposes other than remedying health state, unspecified: Secondary | ICD-10-CM | POA: Diagnosis not present

## 2023-04-01 DIAGNOSIS — Z1239 Encounter for other screening for malignant neoplasm of breast: Secondary | ICD-10-CM | POA: Diagnosis not present

## 2023-04-01 DIAGNOSIS — Z113 Encounter for screening for infections with a predominantly sexual mode of transmission: Secondary | ICD-10-CM | POA: Diagnosis not present

## 2023-04-01 DIAGNOSIS — Z01419 Encounter for gynecological examination (general) (routine) without abnormal findings: Secondary | ICD-10-CM | POA: Diagnosis not present

## 2023-04-01 DIAGNOSIS — Z0001 Encounter for general adult medical examination with abnormal findings: Secondary | ICD-10-CM | POA: Diagnosis not present

## 2023-04-01 DIAGNOSIS — N9089 Other specified noninflammatory disorders of vulva and perineum: Secondary | ICD-10-CM | POA: Diagnosis not present

## 2023-04-01 DIAGNOSIS — N898 Other specified noninflammatory disorders of vagina: Secondary | ICD-10-CM | POA: Diagnosis not present

## 2023-04-29 DIAGNOSIS — Z419 Encounter for procedure for purposes other than remedying health state, unspecified: Secondary | ICD-10-CM | POA: Diagnosis not present

## 2023-09-29 DIAGNOSIS — Z419 Encounter for procedure for purposes other than remedying health state, unspecified: Secondary | ICD-10-CM | POA: Diagnosis not present

## 2023-10-30 DIAGNOSIS — Z419 Encounter for procedure for purposes other than remedying health state, unspecified: Secondary | ICD-10-CM | POA: Diagnosis not present

## 2023-11-29 DIAGNOSIS — Z419 Encounter for procedure for purposes other than remedying health state, unspecified: Secondary | ICD-10-CM | POA: Diagnosis not present

## 2023-12-30 DIAGNOSIS — Z419 Encounter for procedure for purposes other than remedying health state, unspecified: Secondary | ICD-10-CM | POA: Diagnosis not present

## 2024-01-30 DIAGNOSIS — Z419 Encounter for procedure for purposes other than remedying health state, unspecified: Secondary | ICD-10-CM | POA: Diagnosis not present

## 2024-02-27 DIAGNOSIS — Z419 Encounter for procedure for purposes other than remedying health state, unspecified: Secondary | ICD-10-CM | POA: Diagnosis not present

## 2024-04-09 DIAGNOSIS — Z419 Encounter for procedure for purposes other than remedying health state, unspecified: Secondary | ICD-10-CM | POA: Diagnosis not present

## 2024-05-09 DIAGNOSIS — Z419 Encounter for procedure for purposes other than remedying health state, unspecified: Secondary | ICD-10-CM | POA: Diagnosis not present

## 2024-06-09 DIAGNOSIS — Z419 Encounter for procedure for purposes other than remedying health state, unspecified: Secondary | ICD-10-CM | POA: Diagnosis not present

## 2024-07-09 DIAGNOSIS — Z419 Encounter for procedure for purposes other than remedying health state, unspecified: Secondary | ICD-10-CM | POA: Diagnosis not present

## 2024-07-26 DIAGNOSIS — N898 Other specified noninflammatory disorders of vagina: Secondary | ICD-10-CM | POA: Diagnosis not present

## 2024-08-09 DIAGNOSIS — Z419 Encounter for procedure for purposes other than remedying health state, unspecified: Secondary | ICD-10-CM | POA: Diagnosis not present
